# Patient Record
Sex: Female | Born: 1965
Health system: Southern US, Community
[De-identification: ages and names within clinical notes are randomized; demographics above are authoritative.]

## PROBLEM LIST (undated history)

## (undated) DIAGNOSIS — F32A Depression, unspecified: Secondary | ICD-10-CM

## (undated) DIAGNOSIS — F419 Anxiety disorder, unspecified: Secondary | ICD-10-CM

## (undated) DIAGNOSIS — F329 Major depressive disorder, single episode, unspecified: Secondary | ICD-10-CM

## (undated) DIAGNOSIS — J45909 Unspecified asthma, uncomplicated: Secondary | ICD-10-CM

## (undated) HISTORY — DX: Depression, unspecified: F32.A

## (undated) HISTORY — DX: Anxiety disorder, unspecified: F41.9

## (undated) HISTORY — DX: Major depressive disorder, single episode, unspecified: F32.9

## (undated) HISTORY — DX: Unspecified asthma, uncomplicated: J45.909

---

## 1988-06-09 HISTORY — PX: ECTOPIC PREGNANCY SURGERY: SHX613

## 2014-09-19 ENCOUNTER — Encounter: Payer: Self-pay | Admitting: Physician Assistant

## 2014-09-19 ENCOUNTER — Ambulatory Visit (INDEPENDENT_AMBULATORY_CARE_PROVIDER_SITE_OTHER): Payer: BLUE CROSS/BLUE SHIELD | Admitting: Physician Assistant

## 2014-09-19 ENCOUNTER — Ambulatory Visit (INDEPENDENT_AMBULATORY_CARE_PROVIDER_SITE_OTHER): Payer: BLUE CROSS/BLUE SHIELD

## 2014-09-19 VITALS — BP 127/86 | HR 96 | Ht 70.0 in | Wt 217.0 lb

## 2014-09-19 DIAGNOSIS — F329 Major depressive disorder, single episode, unspecified: Secondary | ICD-10-CM | POA: Diagnosis not present

## 2014-09-19 DIAGNOSIS — R109 Unspecified abdominal pain: Secondary | ICD-10-CM

## 2014-09-19 DIAGNOSIS — K5900 Constipation, unspecified: Secondary | ICD-10-CM | POA: Diagnosis not present

## 2014-09-19 DIAGNOSIS — R1084 Generalized abdominal pain: Secondary | ICD-10-CM | POA: Diagnosis not present

## 2014-09-19 DIAGNOSIS — R140 Abdominal distension (gaseous): Secondary | ICD-10-CM

## 2014-09-19 DIAGNOSIS — F411 Generalized anxiety disorder: Secondary | ICD-10-CM

## 2014-09-19 DIAGNOSIS — F32A Depression, unspecified: Secondary | ICD-10-CM

## 2014-09-19 DIAGNOSIS — K589 Irritable bowel syndrome without diarrhea: Secondary | ICD-10-CM | POA: Diagnosis not present

## 2014-09-19 MED ORDER — BUPROPION HCL 100 MG PO TABS: 100.0000 mg | ORAL_TABLET | Freq: Every day | ORAL | Status: DC

## 2014-09-19 MED ORDER — SERTRALINE HCL 50 MG PO TABS: 50.0000 mg | ORAL_TABLET | Freq: Every day | ORAL | Status: DC

## 2014-09-19 NOTE — Progress Notes (Signed)
   Subjective:    Patient ID: Mallory Cooke, female    DOB: Oct 13, 1965, 49 y.o.   MRN: [id number]  HPI   Pt is a 49 yo female who presents to the clinic to establish care.   .. Active Ambulatory Problems    Diagnosis Date Noted  . Depression 09/19/2014  . Generalized anxiety disorder 09/19/2014  . Abdominal discomfort 09/19/2014  . CN (constipation) 09/19/2014   Resolved Ambulatory Problems    Diagnosis Date Noted  . No Resolved Ambulatory Problems   No Additional Past Medical History   .Marland Kitchen. Family History  Problem Relation Age of Onset  . Alcohol abuse Father    .Marland Kitchen. History   Social History  . Marital Status: Married    Spouse Name: N/A  . Number of Children: N/A  . Years of Education: N/A   Occupational History  . Not on file.   Social History Main Topics  . Smoking status: Never Smoker   . Smokeless tobacco: Not on file  . Alcohol Use: Not on file  . Drug Use: Not on file  . Sexual Activity: No   Other Topics Concern  . Not on file   Social History Narrative  . No narrative on file   Pt presents to the clinic GI concerns. She was seeing GI, Dr. Joseph ArtWoods, [facility] but not happy with service. Did a pelvic u/s and scheduled colonscopy but never had colonoscopy done. She felt she was not being communicated with. GI symptoms for over 1 month. No hx of constipation but stools are hard and less frequent and then she will go to diarrhea side. She has constant abdominal discomfort. She has some nausea but no vomiting. No flank pain. No upper abdominal pain mostly on lower. No fever or chills. No blood in stool. Drinking juice and taking fiber every day.    Review of Systems  All other systems reviewed and are negative.      Objective:   Physical Exam  Constitutional: She is oriented to person, place, and time. She appears well-developed and well-nourished.  HENT:  Head: Normocephalic and atraumatic.  Cardiovascular: Normal rate, regular rhythm and normal heart sounds.    Pulmonary/Chest: Effort normal and breath sounds normal. She has no wheezes.  Abdominal: Soft. Bowel sounds are normal. She exhibits no distension and no mass. There is no tenderness. There is no rebound and no guarding.  Neurological: She is alert and oriented to person, place, and time.  Psychiatric: She has a normal mood and affect. Her behavior is normal.          Assessment & Plan:  Depression/anxiety- PHQ-9 was 5. GAD-7 was 7. Refilled wellbutrin and zoloft for 6 months. Continue with counselor.   Constipation/abdominal discomfort/bloating- not happy with Dr. Joseph ArtWoods and staff. Unclear etiology at this point but sounds like she is not having complete bowel movements. Discussed ordering a CT but I feel like might be too soon. Will send to digestive health for colonoscopy. I suggested to try miralax and probiotic. Will get abdominal xray today to look for stool trapped in colon. This could represent some IBS gave HO and discussed to keep diary. Certainly with her increased anxiety about this matter could be effect GI symptoms.   Pt feels like she is up to date on vaccines/mammogram/pap smear will wait to add to get records.

## 2014-09-19 NOTE — Patient Instructions (Signed)
Certainly could try miralax 1 capful daily.  Consider probiotic.   Irritable Bowel Syndrome Irritable bowel syndrome (IBS) is caused by a disturbance of normal bowel function and is a common digestive disorder. You may also hear this condition called spastic colon, mucous colitis, and irritable colon. There is no cure for IBS. However, symptoms often gradually improve or disappear with a good diet, stress management, and medicine. This condition usually appears in late adolescence or early adulthood. Women develop it twice as often as men. CAUSES  After food has been digested and absorbed in the small intestine, waste material is moved into the large intestine, or colon. In the colon, water and salts are absorbed from the undigested products coming from the small intestine. The remaining residue, or fecal material, is held for elimination. Under normal circumstances, gentle, rhythmic contractions of the bowel walls push the fecal material along the colon toward the rectum. In IBS, however, these contractions are irregular and poorly coordinated. The fecal material is either retained too long, resulting in constipation, or expelled too soon, producing diarrhea. SIGNS AND SYMPTOMS  The most common symptom of IBS is abdominal pain. It is often in the lower left side of the abdomen, but it may occur anywhere in the abdomen. The pain comes from spasms of the bowel muscles happening too much and from the buildup of gas and fecal material in the colon. This pain:  Can range from sharp abdominal cramps to a dull, continuous ache.  Often worsens soon after eating.  Is often relieved by having a bowel movement or passing gas. Abdominal pain is usually accompanied by constipation, but it may also produce diarrhea. The diarrhea often occurs right after a meal or upon waking up in the morning. The stools are often soft, watery, and flecked with mucus. Other symptoms of IBS include:  Bloating.  Loss of  appetite.  Heartburn.  Backache.  Dull pain in the arms or shoulders.  Nausea.  Burping.  Vomiting.  Gas. IBS may also cause symptoms that are unrelated to the digestive system, such as:  Fatigue.  Headaches.  Anxiety.  Shortness of breath.  Trouble concentrating.  Dizziness. These symptoms tend to come and go. DIAGNOSIS  The symptoms of IBS may seem like symptoms of other, more serious digestive disorders. Your health care provider may want to perform tests to exclude these disorders.  TREATMENT Many medicines are available to help correct bowel function or relieve bowel spasms and abdominal pain. Among the medicines available are:  Laxatives for severe constipation and to help restore normal bowel habits.  Specific antidiarrheal medicines to treat severe or lasting diarrhea.  Antispasmodic agents to relieve intestinal cramps. Your health care provider may also decide to treat you with a mild tranquilizer or sedative during unusually stressful periods in your life. Your health care provider may also prescribe antidepressant medicine. The use of this medicine has been shown to reduce pain and other symptoms of IBS. Remember that if any medicine is prescribed for you, you should take it exactly as directed. Make sure your health care provider knows how well it worked for you. HOME CARE INSTRUCTIONS   Take all medicines as directed by your health care provider.  Avoid foods that are high in fat or oils, such as heavy cream, butter, frankfurters, sausage, and other fatty meats.  Avoid foods that make you go to the bathroom, such as fruit, fruit juice, and dairy products.  Cut out carbonated drinks, chewing gum, and "gassy" foods  such as beans and cabbage. This may help relieve bloating and burping.  Eat foods with bran, and drink plenty of liquids with the bran foods. This helps relieve constipation.  Keep track of what foods seem to bring on your symptoms.  Avoid  emotionally charged situations or circumstances that produce anxiety.  Start or continue exercising.  Get plenty of rest and sleep. Document Released: 05/26/2005 Document Revised: 05/31/2013 Document Reviewed: 01/14/2008 [facility] Patient Information 2015 [place]. This information is not intended to replace advice given to you by your health care provider. Make sure you discuss any questions you have with your health care provider.

## 2014-09-22 ENCOUNTER — Encounter: Payer: Self-pay | Admitting: Physician Assistant

## 2014-10-11 ENCOUNTER — Encounter: Payer: Self-pay | Admitting: Physician Assistant

## 2014-10-11 NOTE — Telephone Encounter (Signed)
Please be advised that there is a problem with my enrollment in the [facility].  It is currently being investigated and should soon be resolved.  Once it is resolved, either I or [employer] representative will contact your office to request that you resubmit claims.  I am not sure what is going on yet, just know that claims will need to be resubmitted once resolved.  Please let me know that you received this message and know that I am working diligently to get this resolved.  I will be in touch.  Thank you.   msg received from [email or web address]

## 2014-10-27 ENCOUNTER — Emergency Department (INDEPENDENT_AMBULATORY_CARE_PROVIDER_SITE_OTHER)
Admission: EM | Admit: 2014-10-27 | Discharge: 2014-10-27 | Disposition: A | Discharge status: Home / Self Care | Payer: BLUE CROSS/BLUE SHIELD | Source: Home / Self Care | Attending: Emergency Medicine | Admitting: Emergency Medicine

## 2014-10-27 ENCOUNTER — Encounter: Payer: Self-pay | Admitting: *Deleted

## 2014-10-27 DIAGNOSIS — J309 Allergic rhinitis, unspecified: Secondary | ICD-10-CM

## 2014-10-27 DIAGNOSIS — J029 Acute pharyngitis, unspecified: Secondary | ICD-10-CM

## 2014-10-27 LAB — POCT RAPID STREP A (OFFICE): Rapid Strep A Screen: NEGATIVE

## 2014-10-27 MED ORDER — AZITHROMYCIN 250 MG PO TABS: ORAL_TABLET | ORAL | Status: DC

## 2014-10-27 MED ORDER — FLUTICASONE PROPIONATE 50 MCG/ACT NA SUSP: NASAL | Status: DC

## 2014-10-27 NOTE — ED Provider Notes (Signed)
CSN: [id number]     Arrival date & time 10/27/14  0818 History   First MD Initiated Contact with Patient 10/27/14 [phone]     Chief Complaint  Patient presents with  . Sore Throat  . Nasal Congestion  . Generalized Body Aches   (Consider location/radiation/quality/duration/timing/severity/associated sxs/prior Treatment) HPI URI HISTORY  Mallory Cooke is a 49 y.o. female who complains of onset of sore throat, congestion ,cold symptoms for 2 days.  Have been using over-the-counter treatment which helps a little bit.--Some sneezing and allergy symptoms for 3 days.  No chills/sweats No  Fever  +  Nasal congestion, clear drainage +  Discolored Post-nasal drainage No sinus pain/pressure Positive sore throat  No cough She has a history of asthma, but denies wheezing or chest congestion  No chest congestion No hemoptysis No shortness of breath No pleuritic pain  No itchy/red eyes No earache  No nausea No vomiting No abdominal pain No diarrhea  No skin rashes +  Fatigue Mild myalgias Mild, nonfocal headache   Past Medical History  Diagnosis Date  . Depression   . Anxiety    Past Surgical History  Procedure Laterality Date  . Ectopic pregnancy surgery  1990    right tube taken out.    Family History  Problem Relation Age of Onset  . Alcohol abuse Father    History  Substance Use Topics  . Smoking status: Never Smoker   . Smokeless tobacco: Never Used  . Alcohol Use: No   OB History    No data available     Review of Systems Remainder of Review of Systems negative for acute change except as noted in the HPI.  Allergies  Avelox and Other  Home Medications   Prior to Admission medications   Medication Sig Start Date End Date Taking? Authorizing Provider  ALBUTEROL IN Inhale into the lungs as needed.    Historical Provider, MD  azithromycin (ZITHROMAX Z-PAK) 250 MG tablet Take 2 tablets on day one, then 1 tablet daily on days 2 through 5 10/27/14   Lajean Manesavid Massey, MD   buPROPion [facility](WELLBUTRIN) 100 MG tablet Take 1 tablet (100 mg total) by mouth daily. 09/19/14   Jade L Breeback, PA-C  Cyclobenzaprine HCl (FLEXERIL PO) Take by mouth every 30 (thirty) days. Once a Month for Cramps    Historical Provider, MD  fluticasone (FLONASE) 50 MCG/ACT nasal spray 1 or 2 sprays each nostril twice a day 10/27/14   Lajean Manesavid Massey, MD  omeprazole (PRILOSEC OTC) 20 MG tablet Take 20 mg by mouth as needed.    Historical Provider, MD  sertraline (ZOLOFT) 50 MG tablet Take 1 tablet (50 mg total) by mouth daily. 09/19/14   Jade L Breeback, PA-C   BP 116/78 mmHg  Pulse 89  Temp(Src) 98.4 F (36.9 C) (Oral)  Resp 16  Ht 5\' 10"  (1.778 m)  Wt 219 lb (99.338 kg)  BMI 31.42 kg/m2  SpO2 98% Physical Exam  Constitutional: She is oriented to person, place, and time. She appears well-developed and well-nourished. She is cooperative.  Non-toxic appearance. No distress.  HENT:  Head: Normocephalic and atraumatic.  Right Ear: Tympanic membrane, external ear and ear canal normal.  Left Ear: Tympanic membrane, external ear and ear canal normal.  Nose: Right sinus exhibits no maxillary sinus tenderness and no frontal sinus tenderness. Left sinus exhibits no maxillary sinus tenderness and no frontal sinus tenderness.  Mouth/Throat: Mucous membranes are normal. Posterior oropharyngeal erythema present. No oropharyngeal exudate or posterior oropharyngeal edema.  Nose with boggy turbinates, mild congestion, mild serous drainage  Eyes: Conjunctivae are normal. No scleral icterus.  Neck: Neck supple.  Cardiovascular: Normal rate, regular rhythm and normal heart sounds.   No murmur heard. Pulmonary/Chest: Effort normal and breath sounds normal. No stridor. No respiratory distress. She has no wheezes. She has no rales.  Musculoskeletal: She exhibits no edema.  Lymphadenopathy:    She has cervical adenopathy.       Right cervical: Superficial cervical adenopathy present. No deep cervical and no  posterior cervical adenopathy present.      Left cervical: Superficial cervical adenopathy present. No deep cervical and no posterior cervical adenopathy present.  Neurological: She is alert and oriented to person, place, and time.  Skin: Skin is warm and dry.  Psychiatric: She has a normal mood and affect.  Nursing note and vitals reviewed.   ED Course  Procedures (including critical care time) Labs Review Labs Reviewed  POCT RAPID STREP A (OFFICE)   Results for orders placed or performed during the hospital encounter of 10/27/14  POCT rapid strep A  Result Value Ref Range   Rapid Strep A Screen Negative Negative     Imaging Review No results found.   MDM   1. Acute pharyngitis, unspecified pharyngitis type   2. Allergic sinusitis    Likely viral pharyngitis. Treatment options discussed, as well as risks, benefits, alternatives. Patient voiced understanding and agreement with the following plans:  Z-Pak--printed prescription given. Fill this if not improving in 2-3 days or if developing fever or bacterial URI symptoms. Flonase. She has this at home. Advised to use regularly for sinus allergy symptoms. Other OTC decongestant and antihistamine combinations discussed. Follow-up with your primary care doctor in 5-7 days if not improving, or sooner if symptoms become worse. Precautions discussed. Red flags discussed. Questions invited and answered. Patient voiced understanding and agreement.    Lajean Manesavid Massey, MD 10/27/14 [phone]

## 2014-10-27 NOTE — Discharge Instructions (Signed)
Today, rapid strep test was negative.

## 2014-10-27 NOTE — ED Notes (Signed)
Pt c/o 3 days of sneezing, today awoke with sore throat, aches

## 2015-03-26 ENCOUNTER — Other Ambulatory Visit: Payer: Self-pay | Admitting: Physician Assistant

## 2015-05-12 ENCOUNTER — Other Ambulatory Visit: Payer: Self-pay | Admitting: Physician Assistant

## 2015-06-06 ENCOUNTER — Encounter: Payer: Self-pay | Admitting: Physician Assistant

## 2015-06-06 ENCOUNTER — Other Ambulatory Visit: Payer: Self-pay | Admitting: Physician Assistant

## 2015-06-06 MED ORDER — BUPROPION HCL 100 MG PO TABS: 100.0000 mg | ORAL_TABLET | Freq: Every day | ORAL | Status: DC

## 2015-06-06 MED ORDER — SERTRALINE HCL 50 MG PO TABS: 50.0000 mg | ORAL_TABLET | Freq: Every day | ORAL | Status: DC

## 2015-10-13 ENCOUNTER — Other Ambulatory Visit: Payer: Self-pay | Admitting: Physician Assistant

## 2015-12-05 ENCOUNTER — Other Ambulatory Visit: Payer: Self-pay | Admitting: Physician Assistant

## 2015-12-08 ENCOUNTER — Other Ambulatory Visit: Payer: Self-pay | Admitting: Physician Assistant

## 2016-01-08 ENCOUNTER — Other Ambulatory Visit: Payer: Self-pay | Admitting: Physician Assistant

## 2016-01-28 ENCOUNTER — Encounter: Payer: Self-pay | Admitting: Physician Assistant

## 2016-01-28 ENCOUNTER — Ambulatory Visit (INDEPENDENT_AMBULATORY_CARE_PROVIDER_SITE_OTHER): Payer: Self-pay | Admitting: Physician Assistant

## 2016-01-28 ENCOUNTER — Ambulatory Visit (INDEPENDENT_AMBULATORY_CARE_PROVIDER_SITE_OTHER): Payer: Self-pay

## 2016-01-28 VITALS — BP 116/64 | HR 84 | Ht 70.0 in | Wt 216.0 lb

## 2016-01-28 DIAGNOSIS — F32A Depression, unspecified: Secondary | ICD-10-CM

## 2016-01-28 DIAGNOSIS — Z79899 Other long term (current) drug therapy: Secondary | ICD-10-CM

## 2016-01-28 DIAGNOSIS — Z13220 Encounter for screening for lipoid disorders: Secondary | ICD-10-CM

## 2016-01-28 DIAGNOSIS — F411 Generalized anxiety disorder: Secondary | ICD-10-CM

## 2016-01-28 DIAGNOSIS — G43109 Migraine with aura, not intractable, without status migrainosus: Secondary | ICD-10-CM | POA: Insufficient documentation

## 2016-01-28 DIAGNOSIS — Z87448 Personal history of other diseases of urinary system: Secondary | ICD-10-CM

## 2016-01-28 DIAGNOSIS — F329 Major depressive disorder, single episode, unspecified: Secondary | ICD-10-CM

## 2016-01-28 DIAGNOSIS — M79671 Pain in right foot: Secondary | ICD-10-CM

## 2016-01-28 LAB — POCT URINALYSIS DIPSTICK
BILIRUBIN UA: NEGATIVE
Blood, UA: NEGATIVE
Glucose, UA: NEGATIVE
Ketones, UA: NEGATIVE
Leukocytes, UA: NEGATIVE
NITRITE UA: NEGATIVE
PH UA: 6.5
PROTEIN UA: NEGATIVE
Spec Grav, UA: 1.025
Urobilinogen, UA: 0.2

## 2016-01-28 MED ORDER — BUPROPION HCL 100 MG PO TABS: 100.0000 mg | ORAL_TABLET | Freq: Every day | ORAL | 4 refills | Status: DC

## 2016-01-28 MED ORDER — SERTRALINE HCL 50 MG PO TABS: 50.0000 mg | ORAL_TABLET | Freq: Every day | ORAL | 4 refills | Status: DC

## 2016-01-28 NOTE — Progress Notes (Signed)
   Subjective:    Patient ID: Mallory Cooke, female    DOB: 05/09/1966, 50 y.o.   MRN: [id number]  HPI  Patient is a 50 year old female who presents to the clinic for medication refill on depression and anxiety. It has been over a year. She is on Zoloft and Wellbutrin. She is doing well. She denies any anxiety or depression. She denies any suicidal or homicidal thoughts.  She also complains of one week of right foot pain and mass that has developed on her plantar foot on ball of foot. She does not notice the mass getting bigger but is not getting smaller. If feels like she is walking on an egg. Walking does make worse. She does walk quite a bit at work. She does remember about a week ago she came a ottoman and bruising her second and third toes. Massing to develop later. She is taking Advil for her headaches but has not made any correlation if helps with the symptoms. She has not done anything else to make better.   Review of Systems See HPI.     Objective:   Physical Exam  Constitutional: She appears well-developed and well-nourished.  HENT:  Head: Normocephalic and atraumatic.  Cardiovascular: Normal rate, regular rhythm and normal heart sounds.   Musculoskeletal:  2cm by 2.5cm soft tender mass of Right foot plantar surface around ball of foot between 2nd and 3rd toes. No erythema.           Assessment & Plan:  Depression/Anxiety- PHQ-9 was 2. GAD-7 was 3. Refilled wellbutrin and zoloft for one year. cmp ordered.   Right foot pain/growth of right foot- xray ordered of right foot no acute findings. Appears to be morton neuroma or plantar fibroma(although usually take more time to develop). Discussed ice, NSAID, and good supportive shoe. Follow up with sports medicine for orthotics.    Hx of hematuria- .. Results for orders placed or performed in visit on 01/28/16  POCT urinalysis dipstick  Result Value Ref Range   Color, UA yellow    Clarity, UA clear    Glucose, UA neg    Bilirubin, UA neg    Ketones, UA neg    Spec Grav, UA 1.025    Blood, UA neg    pH, UA 6.5    Protein, UA neg    Urobilinogen, UA 0.2    Nitrite, UA neg    Leukocytes, UA Negative Negative   Reassured pt looked good.   Lipid and cmp ordered.

## 2016-01-28 NOTE — Patient Instructions (Addendum)
Regular advil/ice/supportive shoes  Morton Neuroma/Plantar fibroma  Morton Neuralgia Morton neuralgia is a type of foot pain in the area closest to your toes. This area is sometimes called the ball of your foot. Morton neuralgia occurs when a branch of a nerve in your foot (digital nerve) becomes compressed.  When this happens over a long period of time, the nerve can thicken (neuroma) and cause pain. This usually occurs between the third and fourth toe. Morton neuralgia can come and go but may get worse over time.  CAUSES Your digital nerve can become compressed and stretched at a point where it passes under a thick band of tissue that connects your toes (intermetatarsal ligament). Morton neuralgia can be caused by mild repetitive damage in this area. This type of damage can result from:   Activities such as running or jumping.  Wearing shoes that are too tight. RISK FACTORS You may be at risk for Morton neuralgia if you:  Are female.  Wear high heels.  Wear shoes that are narrow or tight.  Participate in activities that stretch your toes. These include:  Running.  Ballet.  Long-distance walking. SIGNS AND SYMPTOMS The first symptom of Morton neuralgia is pain that spreads from the ball of your foot to your toes. It may feel like you are walking on a marble. Pain usually gets worse with walking and goes away at night. Other symptoms may include numbness and cramping of your toes. DIAGNOSIS  Your health care provider will do a physical exam. When doing the exam, your health care provider may:   Squeeze your foot just behind your toe.  Ask you to move your toes to check for pain. You may also have tests on your foot to confirm the diagnosis. These may include:   An X-ray.  An MRI. TREATMENT  Treatment for Morton neuralgia may be as simple as changing the kind of shoes you wear. Other treatments may include:  Wearing a supportive pad (orthosis) under the front of your foot.  This lifts your toe bones and takes pressure off the nerve.  Getting injections of numbing medicine and anti-inflammatory medicine (steroid) in the nerve.  Having surgery to remove part of the thickened nerve. HOME CARE INSTRUCTIONS   Take medicine only as directed by your health care provider.  Wear soft-soled shoes with a wide toe area.  Stop activities that may be causing pain.  Elevate your foot when resting.  Massage your foot.  Apply ice to the injured area:   Put ice in a plastic bag.  Place a towel between your skin and the bag.  Leave the ice on for 20 minutes, 2-3 times a day.   Keep all follow-up visits as directed by your health care provider. This is important. SEEK MEDICAL CARE IF:  Home care instructions are not helping you get better.  Your symptoms change or get worse.   This information is not intended to replace advice given to you by your health care provider. Make sure you discuss any questions you have with your health care provider.   Document Released: 09/01/2000 Document Revised: 06/16/2014 Document Reviewed: 07/27/2013 Elsevier Interactive Patient Education [employer]2016 Elsevier Inc.

## 2016-01-29 DIAGNOSIS — Z87448 Personal history of other diseases of urinary system: Secondary | ICD-10-CM | POA: Insufficient documentation

## 2016-01-29 DIAGNOSIS — M79671 Pain in right foot: Secondary | ICD-10-CM | POA: Insufficient documentation

## 2016-09-01 ENCOUNTER — Other Ambulatory Visit: Payer: Self-pay | Admitting: Physician Assistant

## 2016-12-04 ENCOUNTER — Other Ambulatory Visit: Payer: Self-pay | Admitting: Physician Assistant

## 2017-03-11 ENCOUNTER — Ambulatory Visit (INDEPENDENT_AMBULATORY_CARE_PROVIDER_SITE_OTHER): Payer: Managed Care, Other (non HMO) | Admitting: Physician Assistant

## 2017-03-11 ENCOUNTER — Encounter: Payer: Self-pay | Admitting: Physician Assistant

## 2017-03-11 VITALS — BP 127/59 | HR 74 | Ht 70.0 in | Wt 214.0 lb

## 2017-03-11 DIAGNOSIS — Z79899 Other long term (current) drug therapy: Secondary | ICD-10-CM | POA: Diagnosis not present

## 2017-03-11 DIAGNOSIS — J45909 Unspecified asthma, uncomplicated: Secondary | ICD-10-CM | POA: Insufficient documentation

## 2017-03-11 DIAGNOSIS — Z13220 Encounter for screening for lipoid disorders: Secondary | ICD-10-CM | POA: Diagnosis not present

## 2017-03-11 DIAGNOSIS — F411 Generalized anxiety disorder: Secondary | ICD-10-CM

## 2017-03-11 DIAGNOSIS — F330 Major depressive disorder, recurrent, mild: Secondary | ICD-10-CM

## 2017-03-11 DIAGNOSIS — R109 Unspecified abdominal pain: Secondary | ICD-10-CM | POA: Diagnosis not present

## 2017-03-11 MED ORDER — CYCLOBENZAPRINE HCL 10 MG PO TABS: ORAL_TABLET | ORAL | 3 refills | Status: DC

## 2017-03-11 MED ORDER — SERTRALINE HCL 50 MG PO TABS: 50.0000 mg | ORAL_TABLET | Freq: Every day | ORAL | 4 refills | Status: DC

## 2017-03-11 MED ORDER — BUPROPION HCL 100 MG PO TABS: 100.0000 mg | ORAL_TABLET | Freq: Two times a day (BID) | ORAL | 3 refills | Status: DC

## 2017-03-11 NOTE — Progress Notes (Signed)
   Subjective:    Patient ID: Mallory Cooke, female    DOB: January 13, 1966, 51 y.o.   MRN: [id number]  HPI  Pt is a 51 yo female who presents to the clinic for medication refill. She is on zoloft and wellbutrin but only taking wellbutrin once a day. She does not feel like she is where she wants to be with mood. The only other antidepressant is celexa and did not have great benefit. Pt denies any SI/HC. She is not exercising.   Pt uses flexeril as needed for abdominal discomfort. She does not use often maybe once a month. She needs refill.   .. Active Ambulatory Problems    Diagnosis Date Noted  . Depression 09/19/2014  . Generalized anxiety disorder 09/19/2014  . Migraine with aura and without status migrainosus, not intractable 01/28/2016  . History of hematuria 01/29/2016  . Asthma 03/11/2017   Resolved Ambulatory Problems    Diagnosis Date Noted  . Abdominal discomfort 09/19/2014  . CN (constipation) 09/19/2014  . Acute pain of right foot 01/29/2016   Past Medical History:  Diagnosis Date  . Anxiety   . Asthma   . Depression       Review of Systems  All other systems reviewed and are negative.      Objective:   Physical Exam  Constitutional: She is oriented to person, place, and time. She appears well-developed and well-nourished.  HENT:  Head: Normocephalic and atraumatic.  Cardiovascular: Normal rate, regular rhythm and normal heart sounds.   Pulmonary/Chest: Effort normal and breath sounds normal.  Neurological: She is alert and oriented to person, place, and time.  Psychiatric: She has a normal mood and affect. Her behavior is normal.          Assessment & Plan:  Marland KitchenMarland KitchenDiagnoses and all orders for this visit:  Mild episode of recurrent major depressive disorder (HCC) -     buPROPion (WELLBUTRIN) 100 MG tablet; Take 1 tablet (100 mg total) by mouth 2 (two) times daily. -     sertraline (ZOLOFT) 50 MG tablet; Take 1 tablet (50 mg total) by mouth  daily.  Uncomplicated asthma, unspecified asthma severity, unspecified whether persistent  Generalized anxiety disorder  Medication management -     COMPLETE METABOLIC PANEL WITH GFR  Screening for lipid disorders -     Lipid Panel w/reflex Direct LDL  Other orders -     cyclobenzaprine (FLEXERIL) 10 MG tablet; TAKE ONE TABLET EVERY 8 HOURS AS NEEDED FOR PAIN   .Marland Kitchen Depression screen [facility] 2/9 03/11/2017 01/28/2016  Decreased Interest 0 0  Down, Depressed, Hopeless 1 1  PHQ - 2 Score 1 1  Altered sleeping - 0  Tired, decreased energy - 1  Change in appetite - 0  Feeling bad or failure about yourself  - 0  Trouble concentrating - 0  Moving slowly or fidgety/restless - 0  Suicidal thoughts - 0  PHQ-9 Score - 2   She would like to increase her medication but when she increases zoloft causes headaches. Discussed other options for medication for depression anxiety. She does not want to make any changes today. Printed out options for her to consider and follow up when she does. In meantime sent refills. Encouraged patient to take wellbutrin twice a day as directed.   Pt is interested in genesight to determine what anti-depressant would be the best for her.   Fasting labs ordered.

## 2017-07-03 DIAGNOSIS — F4322 Adjustment disorder with anxiety: Secondary | ICD-10-CM | POA: Diagnosis not present

## 2017-07-23 DIAGNOSIS — F4322 Adjustment disorder with anxiety: Secondary | ICD-10-CM | POA: Diagnosis not present

## 2017-08-13 DIAGNOSIS — F4322 Adjustment disorder with anxiety: Secondary | ICD-10-CM | POA: Diagnosis not present

## 2017-08-27 DIAGNOSIS — F4322 Adjustment disorder with anxiety: Secondary | ICD-10-CM | POA: Diagnosis not present

## 2017-09-10 DIAGNOSIS — F4322 Adjustment disorder with anxiety: Secondary | ICD-10-CM | POA: Diagnosis not present

## 2017-09-24 DIAGNOSIS — F4322 Adjustment disorder with anxiety: Secondary | ICD-10-CM | POA: Diagnosis not present

## 2017-10-15 DIAGNOSIS — F4322 Adjustment disorder with anxiety: Secondary | ICD-10-CM | POA: Diagnosis not present

## 2017-10-22 DIAGNOSIS — F4322 Adjustment disorder with anxiety: Secondary | ICD-10-CM | POA: Diagnosis not present

## 2017-11-05 DIAGNOSIS — F4322 Adjustment disorder with anxiety: Secondary | ICD-10-CM | POA: Diagnosis not present

## 2017-11-09 DIAGNOSIS — F4322 Adjustment disorder with anxiety: Secondary | ICD-10-CM | POA: Diagnosis not present

## 2017-11-18 ENCOUNTER — Telehealth: Payer: Self-pay | Admitting: Physician Assistant

## 2017-11-18 ENCOUNTER — Ambulatory Visit (INDEPENDENT_AMBULATORY_CARE_PROVIDER_SITE_OTHER): Payer: BLUE CROSS/BLUE SHIELD | Admitting: Physician Assistant

## 2017-11-18 ENCOUNTER — Encounter: Payer: Self-pay | Admitting: Physician Assistant

## 2017-11-18 ENCOUNTER — Ambulatory Visit (INDEPENDENT_AMBULATORY_CARE_PROVIDER_SITE_OTHER): Payer: BLUE CROSS/BLUE SHIELD

## 2017-11-18 VITALS — BP 108/62 | HR 77 | Ht 70.0 in | Wt 213.0 lb

## 2017-11-18 DIAGNOSIS — S6991XA Unspecified injury of right wrist, hand and finger(s), initial encounter: Secondary | ICD-10-CM

## 2017-11-18 DIAGNOSIS — S6721XA Crushing injury of right hand, initial encounter: Secondary | ICD-10-CM

## 2017-11-18 DIAGNOSIS — M79641 Pain in right hand: Secondary | ICD-10-CM | POA: Diagnosis not present

## 2017-11-18 NOTE — Progress Notes (Signed)
   Subjective:    Patient ID: Mallory Cooke, female    DOB: [birth year], 52 y.o.   MRN: [id number]  HPI  Pt is a 52 yo female who presents to the clinic with right hand pain that is persisent after May 4th crushing incident of right hand. She is unsure what her hand got crushed in between. She was helping her mother ambulate when occurred. She thought would resolve on its own. Pain in the lateral to mid right hand continues. She is right handed and concerned because now just using a mouse causes pain the right hand. She does feel like there is some weakness as well. She can't open jars like she once could.   .. Active Ambulatory Problems    Diagnosis Date Noted  . Depression 09/19/2014  . Generalized anxiety disorder 09/19/2014  . Migraine with aura and without status migrainosus, not intractable 01/28/2016  . History of hematuria 01/29/2016  . Asthma 03/11/2017  . Crushing injury of right hand 11/20/2017  . Right hand pain 11/20/2017   Resolved Ambulatory Problems    Diagnosis Date Noted  . Abdominal discomfort 09/19/2014  . CN (constipation) 09/19/2014  . Acute pain of right foot 01/29/2016   Past Medical History:  Diagnosis Date  . Anxiety   . Asthma   . Depression       Review of Systems See HPI.     Objective:   Physical Exam  Constitutional: She is oriented to person, place, and time. She appears well-developed and well-nourished.  HENT:  Head: Normocephalic and atraumatic.  Musculoskeletal:  Right hand:  No bruising or swelling noted.  NROM at wrist.  Hand grip 4/5.  Pain to palpation just under the 3rd MCP.  Pain with extension and flexion.   Neurological: She is alert and oriented to person, place, and time.          Assessment & Plan:  Marland Kitchen.Marland Kitchen.Diagnoses and all orders for this visit:  Crushing injury of right hand, initial encounter  Right hand pain   Xray showed no fracture. Suspect some extensor tendons of the hand with some tendonitis. Pt has brace  at home encouraged to wear for a wee or so to give it some rest. Ibuprofen 800mg  for next 5 days. Ice twice a day. Follow up with sports medicine if not improving. May need MRI.

## 2017-11-18 NOTE — Telephone Encounter (Signed)
Right hand complete x-ray ordered.

## 2017-11-18 NOTE — Telephone Encounter (Signed)
Radiology reviewed and confirmed no fracture. Treatment plan stays the same.

## 2017-11-20 ENCOUNTER — Encounter: Payer: Self-pay | Admitting: Physician Assistant

## 2017-11-20 DIAGNOSIS — S6721XA Crushing injury of right hand, initial encounter: Secondary | ICD-10-CM | POA: Insufficient documentation

## 2017-11-20 DIAGNOSIS — M79641 Pain in right hand: Secondary | ICD-10-CM | POA: Insufficient documentation

## 2017-11-30 DIAGNOSIS — F4322 Adjustment disorder with anxiety: Secondary | ICD-10-CM | POA: Diagnosis not present

## 2017-12-17 DIAGNOSIS — F4322 Adjustment disorder with anxiety: Secondary | ICD-10-CM | POA: Diagnosis not present

## 2017-12-31 DIAGNOSIS — F4322 Adjustment disorder with anxiety: Secondary | ICD-10-CM | POA: Diagnosis not present

## 2018-01-28 DIAGNOSIS — F4322 Adjustment disorder with anxiety: Secondary | ICD-10-CM | POA: Diagnosis not present

## 2018-02-11 DIAGNOSIS — F4322 Adjustment disorder with anxiety: Secondary | ICD-10-CM | POA: Diagnosis not present

## 2018-03-01 DIAGNOSIS — F4322 Adjustment disorder with anxiety: Secondary | ICD-10-CM | POA: Diagnosis not present

## 2018-03-16 ENCOUNTER — Other Ambulatory Visit: Payer: Self-pay | Admitting: Physician Assistant

## 2018-03-16 DIAGNOSIS — F330 Major depressive disorder, recurrent, mild: Secondary | ICD-10-CM

## 2018-03-29 DIAGNOSIS — F4322 Adjustment disorder with anxiety: Secondary | ICD-10-CM | POA: Diagnosis not present

## 2018-04-12 DIAGNOSIS — F4322 Adjustment disorder with anxiety: Secondary | ICD-10-CM | POA: Diagnosis not present

## 2018-04-29 DIAGNOSIS — F4322 Adjustment disorder with anxiety: Secondary | ICD-10-CM | POA: Diagnosis not present

## 2018-05-04 ENCOUNTER — Other Ambulatory Visit: Payer: Self-pay | Admitting: Physician Assistant

## 2018-05-04 DIAGNOSIS — F330 Major depressive disorder, recurrent, mild: Secondary | ICD-10-CM

## 2018-05-31 ENCOUNTER — Telehealth: Payer: Self-pay | Admitting: Physician Assistant

## 2018-05-31 ENCOUNTER — Encounter: Payer: Self-pay | Admitting: Physician Assistant

## 2018-05-31 ENCOUNTER — Ambulatory Visit (INDEPENDENT_AMBULATORY_CARE_PROVIDER_SITE_OTHER): Payer: BLUE CROSS/BLUE SHIELD | Admitting: Physician Assistant

## 2018-05-31 VITALS — BP 112/69 | HR 73 | Ht 70.0 in | Wt 216.0 lb

## 2018-05-31 DIAGNOSIS — Z79899 Other long term (current) drug therapy: Secondary | ICD-10-CM | POA: Diagnosis not present

## 2018-05-31 DIAGNOSIS — Z13220 Encounter for screening for lipoid disorders: Secondary | ICD-10-CM

## 2018-05-31 DIAGNOSIS — Z1211 Encounter for screening for malignant neoplasm of colon: Secondary | ICD-10-CM | POA: Diagnosis not present

## 2018-05-31 DIAGNOSIS — F411 Generalized anxiety disorder: Secondary | ICD-10-CM

## 2018-05-31 DIAGNOSIS — Z1231 Encounter for screening mammogram for malignant neoplasm of breast: Secondary | ICD-10-CM

## 2018-05-31 DIAGNOSIS — F330 Major depressive disorder, recurrent, mild: Secondary | ICD-10-CM | POA: Diagnosis not present

## 2018-05-31 MED ORDER — SERTRALINE HCL 50 MG PO TABS: 50.0000 mg | ORAL_TABLET | Freq: Every day | ORAL | 4 refills | Status: DC

## 2018-05-31 MED ORDER — BUPROPION HCL 100 MG PO TABS: 100.0000 mg | ORAL_TABLET | Freq: Two times a day (BID) | ORAL | 4 refills | Status: DC

## 2018-05-31 NOTE — Progress Notes (Signed)
Subjective:    Patient ID: Mallory Cooke, female    DOB: Nov 26, 1965, 52 y.o.   MRN: [id number]  HPI  Pt is a 52 yo female with GAD, MDD who presents to the clinic for medication refill.   Pt feels like her anxiety and depression are controlled. No SI/HC. Taking zoloft and wellbutrin without any problems.   .. Active Ambulatory Problems    Diagnosis Date Noted  . Depression 09/19/2014  . Generalized anxiety disorder 09/19/2014  . Migraine with aura and without status migrainosus, not intractable 01/28/2016  . History of hematuria 01/29/2016  . Asthma 03/11/2017  . Crushing injury of right hand 11/20/2017  . Right hand pain 11/20/2017   Resolved Ambulatory Problems    Diagnosis Date Noted  . Abdominal discomfort 09/19/2014  . CN (constipation) 09/19/2014  . Acute pain of right foot 01/29/2016   Past Medical History:  Diagnosis Date  . Anxiety      Review of Systems  All other systems reviewed and are negative.      Objective:   Physical Exam Constitutional:      Appearance: Normal appearance.  HENT:     Head: Normocephalic and atraumatic.  Cardiovascular:     Rate and Rhythm: Normal rate and regular rhythm.  Pulmonary:     Effort: Pulmonary effort is normal.     Breath sounds: Normal breath sounds.  Neurological:     General: No focal deficit present.     Mental Status: She is alert and oriented to person, place, and time.  Psychiatric:        Mood and Affect: Mood normal.        Behavior: Behavior normal.           Assessment & Plan:  Marland Kitchen.Marland Kitchen.Mallory Cooke was seen today for follow-up.  Diagnoses and all orders for this visit:  Medication management -     COMPLETE METABOLIC PANEL WITH GFR  Mild episode of recurrent major depressive disorder (HCC) -     buPROPion (WELLBUTRIN) 100 MG tablet; Take 1 tablet (100 mg total) by mouth 2 (two) times daily. -     sertraline (ZOLOFT) 50 MG tablet; Take 1 tablet (50 mg total) by mouth daily.  Screening for lipid  disorders -     Lipid Panel w/reflex Direct LDL  Colon cancer screening -     Cologuard  Visit for screening mammogram -     MM 3D SCREEN BREAST BILATERAL   .Marland Kitchen. [facility] 05/31/2018 03/11/2017 01/28/2016  Decreased Interest 1 0 0  Down, Depressed, Hopeless 0 1 1  PHQ - 2 Score 1 1 1   Altered sleeping 0 - 0  Tired, decreased energy 1 - 1  Change in appetite 0 - 0  Feeling bad or failure about yourself  2 - 0  Trouble concentrating 1 - 0  Moving slowly or fidgety/restless 1 - 0  Suicidal thoughts 0 - 0  PHQ-9 Score 6 - 2  Difficult doing work/chores Not difficult at all - -   .Marland Kitchen. GAD 7 : Generalized Anxiety Score 05/31/2018 01/28/2016  Nervous, Anxious, on Edge 1 1  Control/stop worrying 0 0  Worry too much - different things 1 1  Trouble relaxing 0 0  Restless 0 0  Easily annoyed or irritable 1 0  Afraid - awful might happen 0 1  Total GAD 7 Score 3 3  Anxiety Difficulty Not difficult at all Not difficult at all    Refilled medications.  Pt needs CPE.  Discussed preventative health.  Mammogram and cologuard ordered.

## 2018-05-31 NOTE — Patient Instructions (Addendum)
Pap/mammogram/cologuard/labs/TDAP.   Needs CPE.

## 2018-05-31 NOTE — Telephone Encounter (Signed)
I printed cologuard information. Please make sure that was sent.

## 2018-06-03 NOTE — Telephone Encounter (Signed)
Cologuard information has been faxed over. Thanks!

## 2018-06-26 ENCOUNTER — Encounter: Payer: Self-pay | Admitting: Physician Assistant

## 2018-06-28 DIAGNOSIS — F4322 Adjustment disorder with anxiety: Secondary | ICD-10-CM | POA: Diagnosis not present

## 2018-07-07 ENCOUNTER — Ambulatory Visit (INDEPENDENT_AMBULATORY_CARE_PROVIDER_SITE_OTHER): Payer: BLUE CROSS/BLUE SHIELD | Admitting: Physician Assistant

## 2018-07-07 ENCOUNTER — Other Ambulatory Visit (HOSPITAL_COMMUNITY)
Admission: RE | Admit: 2018-07-07 | Discharge: 2018-07-07 | Disposition: A | Discharge status: Home / Self Care | Payer: BLUE CROSS/BLUE SHIELD | Source: Ambulatory Visit | Attending: Physician Assistant | Admitting: Physician Assistant

## 2018-07-07 ENCOUNTER — Ambulatory Visit (INDEPENDENT_AMBULATORY_CARE_PROVIDER_SITE_OTHER): Payer: BLUE CROSS/BLUE SHIELD

## 2018-07-07 ENCOUNTER — Encounter: Payer: Self-pay | Admitting: Physician Assistant

## 2018-07-07 VITALS — BP 117/57 | HR 74 | Ht 70.0 in | Wt 221.0 lb

## 2018-07-07 DIAGNOSIS — Z1211 Encounter for screening for malignant neoplasm of colon: Secondary | ICD-10-CM | POA: Diagnosis not present

## 2018-07-07 DIAGNOSIS — Z01419 Encounter for gynecological examination (general) (routine) without abnormal findings: Secondary | ICD-10-CM | POA: Diagnosis not present

## 2018-07-07 DIAGNOSIS — Z23 Encounter for immunization: Secondary | ICD-10-CM

## 2018-07-07 DIAGNOSIS — Z0000 Encounter for general adult medical examination without abnormal findings: Secondary | ICD-10-CM

## 2018-07-07 DIAGNOSIS — Z13220 Encounter for screening for lipoid disorders: Secondary | ICD-10-CM | POA: Diagnosis not present

## 2018-07-07 DIAGNOSIS — Z1231 Encounter for screening mammogram for malignant neoplasm of breast: Secondary | ICD-10-CM | POA: Diagnosis not present

## 2018-07-07 DIAGNOSIS — Z79899 Other long term (current) drug therapy: Secondary | ICD-10-CM | POA: Diagnosis not present

## 2018-07-07 LAB — COMPLETE METABOLIC PANEL WITH GFR
AG Ratio: 1.9 (calc) (ref 1.0–2.5)
ALT: 11 U/L (ref 6–29)
AST: 11 U/L (ref 10–35)
Albumin: 4.4 g/dL (ref 3.6–5.1)
Alkaline phosphatase (APISO): 87 U/L (ref 33–130)
BUN: 10 mg/dL (ref 7–25)
CALCIUM: 9.3 mg/dL (ref 8.6–10.4)
CO2: 25 mmol/L (ref 20–32)
Chloride: 106 mmol/L (ref 98–110)
Creat: 0.6 mg/dL (ref 0.50–1.05)
GFR, EST AFRICAN AMERICAN: 123 mL/min/{1.73_m2} (ref >=60)
GFR, EST NON AFRICAN AMERICAN: 106 mL/min/{1.73_m2} (ref >=60)
GLUCOSE: 96 mg/dL (ref 65–99)
Globulin: 2.3 g/dL (calc) (ref 1.9–3.7)
Potassium: 4.3 mmol/L (ref 3.5–5.3)
Sodium: 139 mmol/L (ref 135–146)
Total Bilirubin: 0.6 mg/dL (ref 0.2–1.2)
Total Protein: 6.7 g/dL (ref 6.1–8.1)

## 2018-07-07 LAB — LIPID PANEL W/REFLEX DIRECT LDL
Cholesterol: 228 mg/dL — ABNORMAL HIGH (ref <200)
HDL: 46 mg/dL — AB (ref >50)
LDL Cholesterol (Calc): 160 mg/dL (calc) — ABNORMAL HIGH
NON-HDL CHOLESTEROL (CALC): 182 mg/dL — AB (ref <130)
Total CHOL/HDL Ratio: 5 (calc) — ABNORMAL HIGH (ref <5.0)
Triglycerides: 107 mg/dL (ref <150)

## 2018-07-07 NOTE — Progress Notes (Signed)
Call pt: normal mammogram. Follow up in 1 year.

## 2018-07-07 NOTE — Addendum Note (Signed)
Addended by: Myer Peer on: 07/07/2018 08:42 AM   Modules accepted: Orders

## 2018-07-07 NOTE — Progress Notes (Signed)
Subjective:     Sultana Tierney is a 53 y.o. female and is here for a comprehensive physical exam. The patient reports no problems.  She was never sent cologuard kit and needs to be resent.   Social History   Socioeconomic History  . Marital status: Married    Spouse name: Not on file  . Number of children: Not on file  . Years of education: Not on file  . Highest education level: Not on file  Occupational History  . Not on file  Social Needs  . Financial resource strain: Not on file  . Food insecurity:    Worry: Not on file    Inability: Not on file  . Transportation needs:    Medical: Not on file    Non-medical: Not on file  Tobacco Use  . Smoking status: Never Smoker  . Smokeless tobacco: Never Used  Substance and Sexual Activity  . Alcohol use: No    Alcohol/week: 0.0 standard drinks  . Drug use: No  . Sexual activity: Never  Lifestyle  . Physical activity:    Days per week: Not on file    Minutes per session: Not on file  . Stress: Not on file  Relationships  . Social connections:    Talks on phone: Not on file    Gets together: Not on file    Attends religious service: Not on file    Active member of club or organization: Not on file    Attends meetings of clubs or organizations: Not on file    Relationship status: Not on file  . Intimate partner violence:    Fear of current or ex partner: Not on file    Emotionally abused: Not on file    Physically abused: Not on file    Forced sexual activity: Not on file  Other Topics Concern  . Not on file  Social History Narrative  . Not on file   Health Maintenance  Topic Date Due  . PAP SMEAR-Modifier  10/26/1988  . MAMMOGRAM  10/26/2017  . COLONOSCOPY  06/01/2019 (Originally 10/26/2017)  . HIV Screening  06/01/2019 (Originally 10/27/1982)  . TETANUS/TDAP  07/08/2019 (Originally 10/27/1986)  . INFLUENZA VACCINE  05/31/2028 (Originally 01/07/2018)    The following portions of the patient's history were reviewed and  updated as appropriate: allergies, current medications, past family history, past medical history, past social history, past surgical history and problem list.  Review of Systems A comprehensive review of systems was negative.   Objective:    BP (!) 117/57   Pulse 74   Ht _0  (1.778 m)   Wt 221 lb (100.2 kg)   BMI 31.71 kg/m  General appearance: alert, cooperative, appears stated age and mildly obese Head: Normocephalic, without obvious abnormality, atraumatic Eyes: conjunctivae/corneas clear. PERRL, EOM's intact. Fundi benign. Ears: normal TM's and external ear canals both ears Nose: Nares normal. Septum midline. Mucosa normal. No drainage or sinus tenderness. Throat: lips, mucosa, and tongue normal; teeth and gums normal Neck: no adenopathy, no carotid bruit, no JVD, supple, symmetrical, trachea midline and thyroid not enlarged, symmetric, no tenderness/mass/nodules Back: symmetric, no curvature. ROM normal. No CVA tenderness. Lungs: clear to auscultation bilaterally Heart: regular rate and rhythm, S1, S2 normal, no murmur, click, rub or gallop Abdomen: soft, non-tender; bowel sounds normal; no masses,  no organomegaly Pelvic: cervix normal in appearance, external genitalia normal, no adnexal masses or tenderness, no cervical motion tenderness, uterus normal size, shape, and consistency and vagina normal  without discharge Extremities: extremities normal, atraumatic, no cyanosis or edema Pulses: 2+ and symmetric Skin: Skin color, texture, turgor normal. No rashes or lesions Lymph nodes: Cervical, supraclavicular, and axillary nodes normal. Neurologic: Alert and oriented X 3, normal strength and tone. Normal symmetric reflexes. Normal coordination and gait    Assessment:    Healthy female exam.      Plan:    Marland KitchenMarland KitchenLazara was seen today for gynecologic exam.  Diagnoses and all orders for this visit:  Routine physical examination  Colon cancer screening -      Cologuard  Encounter for gynecological examination without abnormal finding   .Marland Kitchen Depression screen [facility] 2/9 07/07/2018 05/31/2018 03/11/2017 01/28/2016  Decreased Interest 0 1 0 0  Down, Depressed, Hopeless 1 0 1 1  PHQ - 2 Score _0 Altered sleeping 0 0 - 0  Tired, decreased energy 1 1 - 1  Change in appetite 0 0 - 0  Feeling bad or failure about yourself  0 2 - 0  Trouble concentrating 1 1 - 0  Moving slowly or fidgety/restless 0 1 - 0  Suicidal thoughts 0 0 - 0  PHQ-9 Score 3 6 - 2  Difficult doing work/chores Not difficult at all Not difficult at all - -   .Marland Kitchen Discussed 150 minutes of exercise a week.  Encouraged vitamin D 1000 units and Calcium 1366m or 4 servings of dairy a day.  Pt has fasting labs and will get drawn today.  Pap done today. Declined STD screening. HPV ordered. May go 5 years if normal.  Mammogram scheduled for today.  Will reorder cologuard kit. Tdap done today.  Discussed shingrix. Pt will consider. May schedule nurse visit if would like.   See After Visit Summary for Counseling Recommendations

## 2018-07-07 NOTE — Patient Instructions (Signed)
Health Maintenance for Postmenopausal Women Menopause is a normal process in which your reproductive ability comes to an end. This process happens gradually over a span of months to years, usually between the ages of 56 and 70. Menopause is complete when you have missed 12 consecutive menstrual periods. It is important to talk with your health care provider about some of the most common conditions that affect postmenopausal women, such as heart disease, cancer, and bone loss (osteoporosis). Adopting a healthy lifestyle and getting preventive care can help to promote your health and wellness. Those actions can also lower your chances of developing some of these common conditions. What should I know about menopause? During menopause, you may experience a number of symptoms, such as:  Moderate-to-severe hot flashes.  Night sweats.  Decrease in sex drive.  Mood swings.  Headaches.  Tiredness.  Irritability.  Memory problems.  Insomnia. Choosing to treat or not to treat menopausal changes is an individual decision that you make with your health care provider. What should I know about hormone replacement therapy and supplements? Hormone therapy products are effective for treating symptoms that are associated with menopause, such as hot flashes and night sweats. Hormone replacement carries certain risks, especially as you become older. If you are thinking about using estrogen or estrogen with progestin treatments, discuss the benefits and risks with your health care provider. What should I know about heart disease and stroke? Heart disease, heart attack, and stroke become more likely as you age. This may be due, in part, to the hormonal changes that your body experiences during menopause. These can affect how your body processes dietary fats, triglycerides, and cholesterol. Heart attack and stroke are both medical emergencies. There are many things that you can do to help prevent heart disease  and stroke:  Have your blood pressure checked at least every 1-2 years. High blood pressure causes heart disease and increases the risk of stroke.  If you are 34-48 years old, ask your health care provider if you should take aspirin to prevent a heart attack or a stroke.  Do not use any tobacco products, including cigarettes, chewing tobacco, or electronic cigarettes. If you need help quitting, ask your health care provider.  It is important to eat a healthy diet and maintain a healthy weight. ? Be sure to include plenty of vegetables, fruits, low-fat dairy products, and lean protein. ? Avoid eating foods that are high in solid fats, added sugars, or salt (sodium).  Get regular exercise. This is one of the most important things that you can do for your health. ? Try to exercise for at least 150 minutes each week. The type of exercise that you do should increase your heart rate and make you sweat. This is known as moderate-intensity exercise. ? Try to do strengthening exercises at least twice each week. Do these in addition to the moderate-intensity exercise.  Know your numbers.Ask your health care provider to check your cholesterol and your blood glucose. Continue to have your blood tested as directed by your health care provider.  What should I know about cancer screening? There are several types of cancer. Take the following steps to reduce your risk and to catch any cancer development as early as possible. Breast Cancer  Practice breast self-awareness. ? This means understanding how your breasts normally appear and feel. ? It also means doing regular breast self-exams. Let your health care provider know about any changes, no matter how small.  If you are 40 or  older, have a clinician do a breast exam (clinical breast exam or CBE) every year. Depending on your age, family history, and medical history, it may be recommended that you also have a yearly breast X-ray (mammogram).  If you  have a family history of breast cancer, talk with your health care provider about genetic screening.  If you are at high risk for breast cancer, talk with your health care provider about having an MRI and a mammogram every year.  Breast cancer (BRCA) gene test is recommended for women who have family members with BRCA-related cancers. Results of the assessment will determine the need for genetic counseling and BRCA1 and for BRCA2 testing. BRCA-related cancers include these types: ? Breast. This occurs in males or females. ? Ovarian. ? Tubal. This may also be called fallopian tube cancer. ? Cancer of the abdominal or pelvic lining (peritoneal cancer). ? Prostate. ? Pancreatic. Cervical, Uterine, and Ovarian Cancer Your health care provider may recommend that you be screened regularly for cancer of the pelvic organs. These include your ovaries, uterus, and vagina. This screening involves a pelvic exam, which includes checking for microscopic changes to the surface of your cervix (Pap test).  For women ages 21-65, health care providers may recommend a pelvic exam and a Pap test every three years. For women ages 39-65, they may recommend the Pap test and pelvic exam, combined with testing for human papilloma virus (HPV), every five years. Some types of HPV increase your risk of cervical cancer. Testing for HPV may also be done on women of any age who have unclear Pap test results.  Other health care providers may not recommend any screening for nonpregnant women who are considered low risk for pelvic cancer and have no symptoms. Ask your health care provider if a screening pelvic exam is right for you.  If you have had past treatment for cervical cancer or a condition that could lead to cancer, you need Pap tests and screening for cancer for at least 20 years after your treatment. If Pap tests have been discontinued for you, your risk factors (such as having a new sexual partner) need to be reassessed  to determine if you should start having screenings again. Some women have medical problems that increase the chance of getting cervical cancer. In these cases, your health care provider may recommend that you have screening and Pap tests more often.  If you have a family history of uterine cancer or ovarian cancer, talk with your health care provider about genetic screening.  If you have vaginal bleeding after reaching menopause, tell your health care provider.  There are currently no reliable tests available to screen for ovarian cancer. Lung Cancer Lung cancer screening is recommended for adults 57-50 years old who are at high risk for lung cancer because of a history of smoking. A yearly low-dose CT scan of the lungs is recommended if you:  Currently smoke.  Have a history of at least 30 pack-years of smoking and you currently smoke or have quit within the past 15 years. A pack-year is smoking an average of one pack of cigarettes per day for one year. Yearly screening should:  Continue until it has been 15 years since you quit.  Stop if you develop a health problem that would prevent you from having lung cancer treatment. Colorectal Cancer  This type of cancer can be detected and can often be prevented.  Routine colorectal cancer screening usually begins at age 12 and continues through  age 71.  If you have risk factors for colon cancer, your health care provider may recommend that you be screened at an earlier age.  If you have a family history of colorectal cancer, talk with your health care provider about genetic screening.  Your health care provider may also recommend using home test kits to check for hidden blood in your stool.  A small camera at the end of a tube can be used to examine your colon directly (sigmoidoscopy or colonoscopy). This is done to check for the earliest forms of colorectal cancer.  Direct examination of the colon should be repeated every 5-10 years until  age 63. However, if early forms of precancerous polyps or small growths are found or if you have a family history or genetic risk for colorectal cancer, you may need to be screened more often. Skin Cancer  Check your skin from head to toe regularly.  Monitor any moles. Be sure to tell your health care provider: ? About any new moles or changes in moles, especially if there is a change in a mole's shape or color. ? If you have a mole that is larger than the size of a pencil eraser.  If any of your family members has a history of skin cancer, especially at a young age, talk with your health care provider about genetic screening.  Always use sunscreen. Apply sunscreen liberally and repeatedly throughout the day.  Whenever you are outside, protect yourself by wearing long sleeves, pants, a wide-brimmed hat, and sunglasses. What should I know about osteoporosis? Osteoporosis is a condition in which bone destruction happens more quickly than new bone creation. After menopause, you may be at an increased risk for osteoporosis. To help prevent osteoporosis or the bone fractures that can happen because of osteoporosis, the following is recommended:  If you are 71-1 years old, get at least 1,000 mg of calcium and at least 600 mg of vitamin D per day.  If you are older than age 75 but younger than age 19, get at least 1,200 mg of calcium and at least 600 mg of vitamin D per day.  If you are older than age 73, get at least 1,200 mg of calcium and at least 800 mg of vitamin D per day. Smoking and excessive alcohol intake increase the risk of osteoporosis. Eat foods that are rich in calcium and vitamin D, and do weight-bearing exercises several times each week as directed by your health care provider. What should I know about how menopause affects my mental health? Depression may occur at any age, but it is more common as you become older. Common symptoms of depression include:  Low or sad  mood.  Changes in sleep patterns.  Changes in appetite or eating patterns.  Feeling an overall lack of motivation or enjoyment of activities that you previously enjoyed.  Frequent crying spells. Talk with your health care provider if you think that you are experiencing depression. What should I know about immunizations? It is important that you get and maintain your immunizations. These include:  Tetanus, diphtheria, and pertussis (Tdap) booster vaccine.  Influenza every year before the flu season begins.  Pneumonia vaccine.  Shingles vaccine. Your health care provider may also recommend other immunizations. This information is not intended to replace advice given to you by your health care provider. Make sure you discuss any questions you have with your health care provider. Document Released: 07/18/2005 Document Revised: 12/14/2015 Document Reviewed: 02/27/2015 Elsevier Interactive Patient Education  2019 [place].

## 2018-07-08 LAB — CYTOLOGY - PAP
Adequacy: ABSENT
Diagnosis: NEGATIVE
HPV (WINDOPATH): NOT DETECTED

## 2018-07-09 ENCOUNTER — Encounter: Payer: Self-pay | Admitting: Physician Assistant

## 2018-07-09 DIAGNOSIS — E785 Hyperlipidemia, unspecified: Secondary | ICD-10-CM | POA: Insufficient documentation

## 2018-07-09 NOTE — Progress Notes (Signed)
Call patient no abnormal cells were detected and HPV was negative. Follow up in 5 years for pap but annual for CPE.

## 2018-07-09 NOTE — Progress Notes (Signed)
Call pt: LDL is elevated at 160. Overall CV 10 year risk is still low at 1.6. working on low fat diet, exercise, and decreasing processed foods can help with this. Will check in 1 year. If continues to elevate I would suggest a cholesterol reducing medication.   Kidney, liver, glucose look great.

## 2018-08-02 DIAGNOSIS — F4322 Adjustment disorder with anxiety: Secondary | ICD-10-CM | POA: Diagnosis not present

## 2018-08-23 DIAGNOSIS — F4322 Adjustment disorder with anxiety: Secondary | ICD-10-CM | POA: Diagnosis not present

## 2018-09-06 DIAGNOSIS — F4322 Adjustment disorder with anxiety: Secondary | ICD-10-CM | POA: Diagnosis not present

## 2018-09-20 DIAGNOSIS — F4322 Adjustment disorder with anxiety: Secondary | ICD-10-CM | POA: Diagnosis not present

## 2018-10-20 DIAGNOSIS — F4322 Adjustment disorder with anxiety: Secondary | ICD-10-CM | POA: Diagnosis not present

## 2018-11-15 DIAGNOSIS — F4322 Adjustment disorder with anxiety: Secondary | ICD-10-CM | POA: Diagnosis not present

## 2018-12-27 DIAGNOSIS — F4322 Adjustment disorder with anxiety: Secondary | ICD-10-CM | POA: Diagnosis not present

## 2019-01-20 DIAGNOSIS — L03031 Cellulitis of right toe: Secondary | ICD-10-CM | POA: Diagnosis not present

## 2019-01-20 DIAGNOSIS — M722 Plantar fascial fibromatosis: Secondary | ICD-10-CM | POA: Diagnosis not present

## 2019-01-20 DIAGNOSIS — L600 Ingrowing nail: Secondary | ICD-10-CM | POA: Diagnosis not present

## 2019-02-08 DIAGNOSIS — F4322 Adjustment disorder with anxiety: Secondary | ICD-10-CM | POA: Diagnosis not present

## 2019-03-07 ENCOUNTER — Ambulatory Visit: Payer: BLUE CROSS/BLUE SHIELD | Admitting: Physician Assistant

## 2019-03-10 ENCOUNTER — Ambulatory Visit: Payer: BLUE CROSS/BLUE SHIELD | Admitting: Physician Assistant

## 2019-03-29 ENCOUNTER — Ambulatory Visit: Payer: BC Managed Care – PPO | Admitting: Physician Assistant

## 2019-03-29 ENCOUNTER — Other Ambulatory Visit: Payer: Self-pay

## 2019-03-29 ENCOUNTER — Ambulatory Visit (INDEPENDENT_AMBULATORY_CARE_PROVIDER_SITE_OTHER): Payer: BC Managed Care – PPO | Admitting: Physician Assistant

## 2019-03-29 ENCOUNTER — Encounter: Payer: Self-pay | Admitting: Physician Assistant

## 2019-03-29 VITALS — BP 109/82 | HR 92 | Ht 70.0 in | Wt 220.0 lb

## 2019-03-29 DIAGNOSIS — B373 Candidiasis of vulva and vagina: Secondary | ICD-10-CM

## 2019-03-29 DIAGNOSIS — B3731 Acute candidiasis of vulva and vagina: Secondary | ICD-10-CM

## 2019-03-29 DIAGNOSIS — K644 Residual hemorrhoidal skin tags: Secondary | ICD-10-CM | POA: Insufficient documentation

## 2019-03-29 DIAGNOSIS — N898 Other specified noninflammatory disorders of vagina: Secondary | ICD-10-CM

## 2019-03-29 LAB — WET PREP FOR TRICH, YEAST, CLUE
MICRO NUMBER:: 1008461
Specimen Quality: ADEQUATE

## 2019-03-29 MED ORDER — FLUCONAZOLE 150 MG PO TABS: 150.0000 mg | ORAL_TABLET | Freq: Once | ORAL | 0 refills | Status: AC

## 2019-03-29 NOTE — Patient Instructions (Signed)
Will call with results.    Skin Tag, Adult  A skin tag (acrochordon) is a soft, extra growth of skin. Most skin tags are flesh-colored and rarely bigger than a pencil eraser. They commonly form near areas where there are folds in the skin, such as the armpit or groin. Skin tags are not dangerous, and they do not spread from person to person (are not contagious). You may have one skin tag or several. Skin tags do not require treatment. However, your health care provider may recommend removal of a skin tag if it:  Gets irritated from clothing.  Bleeds.  Is visible and unsightly. Your health care provider can remove skin tags with a simple surgical procedure or a procedure that involves freezing the skin tag. Follow these instructions at home:  Watch for any changes in your skin tag. A normal skin tag does not require any other special care at home.  Take over-the-counter and prescription medicines only as told by your health care provider.  Keep all follow-up visits as told by your health care provider. This is important. Contact a health care provider if:  You have a skin tag that: ? Becomes painful. ? Changes color. ? Bleeds. ? Swells.  You develop more skin tags. This information is not intended to replace advice given to you by your health care provider. Make sure you discuss any questions you have with your health care provider. Document Released: 06/10/2015 Document Revised: 05/08/2017 Document Reviewed: 06/10/2015 Elsevier Patient Education  2020 [employer].

## 2019-03-29 NOTE — Progress Notes (Signed)
Yeast infection. No BV. Diflucan has already been sent.

## 2019-03-29 NOTE — Progress Notes (Signed)
Subjective:    Patient ID: Mallory Cooke, female    DOB: [birth year], 53 y.o.   MRN: [id number]  HPI  Pt is a 53 yo female with vaginal itching and discharge for the last week or so. She had to use a plastic tampon applicator instead of cardboard and feels vaginally irritated. No odor but does have hx of BV. She admits to using a new soap. No pain with urination, abdominal pain, flank pain, fever, or chills.   She has felt a mass on anus when she is wiping and wants it checked out. No pain.   .. Active Ambulatory Problems    Diagnosis Date Noted  . Depression 09/19/2014  . Generalized anxiety disorder 09/19/2014  . Migraine with aura and without status migrainosus, not intractable 01/28/2016  . History of hematuria 01/29/2016  . Asthma 03/11/2017  . Crushing injury of right hand 11/20/2017  . Right hand pain 11/20/2017  . Dyslipidemia (high LDL; low HDL) 07/09/2018  . Skin tag of anus 03/29/2019   Resolved Ambulatory Problems    Diagnosis Date Noted  . Abdominal discomfort 09/19/2014  . CN (constipation) 09/19/2014  . Acute pain of right foot 01/29/2016   Past Medical History:  Diagnosis Date  . Anxiety       Review of Systems See HPI.     Objective:   Physical Exam Vitals signs reviewed.  Constitutional:      Appearance: Normal appearance.  Cardiovascular:     Rate and Rhythm: Normal rate and regular rhythm.     Pulses: Normal pulses.  Pulmonary:     Effort: Pulmonary effort is normal.     Breath sounds: Normal breath sounds.  Abdominal:     General: Bowel sounds are normal.     Palpations: Abdomen is soft.     Tenderness: There is no abdominal tenderness.  Genitourinary:    General: Normal vulva.     Vagina: Vaginal discharge present.     Comments: White thick discharge.   Pedunculated flesh colored papule on anus.  Neurological:     General: No focal deficit present.     Mental Status: She is alert.  Psychiatric:        Mood and Affect: Mood normal.         Behavior: Behavior normal.           Assessment & Plan:  Marland KitchenMarland KitchenDiagnoses and all orders for this visit:  Vaginal discharge -     WET PREP FOR TRICH, YEAST, CLUE -     fluconazole (DIFLUCAN) 150 MG tablet; Take 1 tablet (150 mg total) by mouth once for 1 dose. Repeat in 48-72 hours if symptoms persist.  Vaginal irritation -     WET PREP FOR TRICH, YEAST, CLUE -     fluconazole (DIFLUCAN) 150 MG tablet; Take 1 tablet (150 mg total) by mouth once for 1 dose. Repeat in 48-72 hours if symptoms persist.  Skin tag of anus  Vaginal yeast infection -     fluconazole (DIFLUCAN) 150 MG tablet; Take 1 tablet (150 mg total) by mouth once for 1 dose. Repeat in 48-72 hours if symptoms persist.   .. Results for orders placed or performed in visit on 03/29/19  WET PREP FOR [place], YEAST, CLUE  Result Value Ref Range   MICRO NUMBER: [id number]    Specimen Quality Adequate    SOURCE: VAGINAL    Status FINAL    RESULT yeast    Confirmed yeast infection. Sent diflucan. Follow  up as needed.   Reassured patient about skin tag on rectum. Will senf for removal if would like.

## 2019-04-03 ENCOUNTER — Encounter: Payer: Self-pay | Admitting: Physician Assistant

## 2019-04-06 DIAGNOSIS — F4322 Adjustment disorder with anxiety: Secondary | ICD-10-CM | POA: Diagnosis not present

## 2019-04-27 ENCOUNTER — Encounter: Payer: Self-pay | Admitting: Physician Assistant

## 2019-04-27 DIAGNOSIS — F4322 Adjustment disorder with anxiety: Secondary | ICD-10-CM | POA: Diagnosis not present

## 2019-04-27 MED ORDER — FLUCONAZOLE 150 MG PO TABS: 150.0000 mg | ORAL_TABLET | Freq: Once | ORAL | 0 refills | Status: AC

## 2019-05-12 ENCOUNTER — Encounter: Payer: Self-pay | Admitting: Physician Assistant

## 2019-05-12 DIAGNOSIS — Z1211 Encounter for screening for malignant neoplasm of colon: Secondary | ICD-10-CM

## 2019-05-12 DIAGNOSIS — K644 Residual hemorrhoidal skin tags: Secondary | ICD-10-CM

## 2019-06-16 ENCOUNTER — Other Ambulatory Visit: Payer: Self-pay | Admitting: Physician Assistant

## 2019-06-16 DIAGNOSIS — F330 Major depressive disorder, recurrent, mild: Secondary | ICD-10-CM

## 2019-06-17 ENCOUNTER — Encounter: Payer: Self-pay | Admitting: Internal Medicine

## 2019-07-05 ENCOUNTER — Other Ambulatory Visit (HOSPITAL_COMMUNITY): Admission: RE | Admit: 2019-07-05 | Payer: BC Managed Care – PPO | Source: Ambulatory Visit

## 2019-07-05 ENCOUNTER — Encounter: Payer: Self-pay | Admitting: Advanced Practice Midwife

## 2019-07-05 ENCOUNTER — Ambulatory Visit: Payer: BC Managed Care – PPO | Admitting: Advanced Practice Midwife

## 2019-07-05 ENCOUNTER — Other Ambulatory Visit: Payer: Self-pay

## 2019-07-05 VITALS — BP 128/81 | HR 81 | Ht 70.0 in | Wt 227.0 lb

## 2019-07-05 DIAGNOSIS — N898 Other specified noninflammatory disorders of vagina: Secondary | ICD-10-CM | POA: Diagnosis not present

## 2019-07-05 DIAGNOSIS — Z113 Encounter for screening for infections with a predominantly sexual mode of transmission: Secondary | ICD-10-CM

## 2019-07-05 DIAGNOSIS — K644 Residual hemorrhoidal skin tags: Secondary | ICD-10-CM

## 2019-07-05 MED ORDER — CLOBETASOL PROPIONATE 0.05 % EX OINT: 1.0000 "application " | TOPICAL_OINTMENT | Freq: Two times a day (BID) | CUTANEOUS | 0 refills | Status: DC

## 2019-07-05 NOTE — Progress Notes (Signed)
  GYNECOLOGY PROGRESS NOTE  History:  54 y.o. G1P0010 presents to [facility] office today for problem gyn visit. She reports vaginal discomfort/irritation and anal skin tag.  She was told she had yeast by her primary care provider but is still having symptoms after Diflucan and Monistat.  She denies any unusual discharge or odor.  She is not sexually active with last intercourse 1 year ago.  She has regular monthly periods.  She denies h/a, dizziness, shortness of breath, n/v, or fever/chills.    The following portions of the patient's history were reviewed and updated as appropriate: allergies, current medications, past family history, past medical history, past social history, past surgical history and problem list. Last pap smear on 07/07/2018 was normal, negative HRHPV.  Review of Systems:  Pertinent items are noted in HPI.   Objective:  Physical Exam Blood pressure 128/81, pulse 81, height 5\' 10"  (1.778 m), weight 227 lb (103 kg), last menstrual period 06/25/2019. VS reviewed, nursing note reviewed,  Constitutional: well developed, well nourished, no distress HEENT: normocephalic CV: normal rate Pulm/chest wall: normal effort Breast Exam: deferred Abdomen: soft Neuro: alert and oriented x 3 Skin: warm, dry Psych: affect normal Pelvic exam: Visual inspection reveals mild erythema of perineal area, no edema, no abnormal discharge.  Assessment & Plan:  1. Vaginal discharge --Pt reports some light discharge but nothing different than usual.  She did notice more sudsy-ness/soapiness in the vaginal area when showering recently.    - Cervicovaginal ancillary only( [place])  2. Vaginal irritation --She reports feeling "twinges" that are not exactly painful but are uncomfortable that occur sometimes. She is concerned that something could be wrong.   3. Anal skin tag --Pt reports skin tag in anal area.  There is no pain and no bleeding.  The skin tag is bothering her however,  and she does not want her husband to see her "down there" with the skin tag.  She wants it removed. --Flesh colored smooth finger-like elongated stalk of tissue, approximately 1 cm x 0.25 cm protruding from anus. No pain to palpation. --Consult Dr 06/27/2019, who recommends referring pt to general surgery for removal.  - Ambulatory referral to General Surgery   Adrian Blackwater, CNM 3:12 PM

## 2019-07-05 NOTE — Progress Notes (Signed)
Pt states she has increased discharge recently and has a skin tag she would like looked at

## 2019-07-07 LAB — CERVICOVAGINAL ANCILLARY ONLY
Bacterial Vaginitis (gardnerella): NEGATIVE
Candida Glabrata: NEGATIVE
Candida Vaginitis: NEGATIVE
Chlamydia: NEGATIVE
Comment: NEGATIVE
Comment: NEGATIVE
Comment: NEGATIVE
Comment: NEGATIVE
Comment: NEGATIVE
Comment: NORMAL
Neisseria Gonorrhea: NEGATIVE
Trichomonas: NEGATIVE

## 2019-07-11 ENCOUNTER — Ambulatory Visit: Payer: BC Managed Care – PPO | Admitting: Physician Assistant

## 2019-07-11 ENCOUNTER — Encounter: Payer: Self-pay | Admitting: Physician Assistant

## 2019-07-11 ENCOUNTER — Other Ambulatory Visit: Payer: Self-pay

## 2019-07-11 VITALS — BP 123/83 | HR 96 | Ht 70.0 in | Wt 222.0 lb

## 2019-07-11 DIAGNOSIS — N750 Cyst of Bartholin's gland: Secondary | ICD-10-CM | POA: Diagnosis not present

## 2019-07-11 NOTE — Patient Instructions (Signed)
Bartholin's Cyst  A Bartholin's cyst is a fluid-filled sac that forms on a Bartholin's gland. Bartholin's glands are small glands in the folds of skin around the opening of the vagina (labia). This type of cyst causes a bulge or lump near the lower opening of the vagina. If you have a cyst that is small and not infected, you may be able to take care of it at home. If your cyst gets infected, it may cause pain and your doctor may need to drain it. An infected Bartholin's cyst is called a Bartholin's abscess. Follow these instructions at home: Medicines  Take over-the-counter and prescription medicines only as told by your doctor.  If you were prescribed an antibiotic medicine, take it as told by your doctor. Do not stop taking the antibiotic even if you start to feel better. Managing pain and swelling  Try sitz baths to help with pain and swelling. A sitz bath is a warm water bath in which the water only comes up to your hips and should cover your buttocks. You may take sitz baths a few times a day.  Put heat on the affected area as often as needed. Use the heat source that your doctor recommends, such as a moist heat pack or a heating pad. ? Place a towel between your skin and the heat source. ? Leave the heat on for 20-30 minutes. ? Remove the heat if your skin turns bright red. This is especially important if you cannot feel pain, heat, or cold. You may have a greater risk of getting burned. General instructions  If your cyst or abscess was drained: ? Follow instructions from your doctor about how to take care of your wound. ? Use feminine pads to absorb any fluid.  Do not push on or squeeze your cyst.  Do not have sex until the cyst has gone away or your wound from drainage has healed.  Take these steps to help prevent a Bartholin's cyst from returning, and to prevent other Bartholin's cysts from forming: ? Take a bath or shower once a day. Clean your vaginal area with mild soap and  water when you bathe. ? Practice safe sex to prevent STIs (sexually transmitted infections). Talk with your doctor about how to prevent STIs and which forms of birth control (contraception) may be best for you.  Keep all follow-up visits as told by your doctor. This is important. Contact a doctor if:  You have a fever.  You get redness, swelling, or pain around your cyst.  You have fluid, blood, pus, or a bad smell coming from your cyst.  You have a cyst that gets larger or comes back. Summary  A Bartholin's cyst is a fluid-filled sac that forms on a Bartholin's gland. These small glands are found in the folds of skin around the opening of the vagina (labia).  This type of cyst causes a bulge or lump near the lower opening of the vagina. An infected Bartholin's cyst is called a Bartholin's abscess.  Try sitz baths a few times a day to help with pain and swelling.  Do not push on or squeeze your cyst. This information is not intended to replace advice given to you by your health care provider. Make sure you discuss any questions you have with your health care provider. Document Revised: 03/18/2018 Document Reviewed: 02/25/2017 Elsevier Patient Education  2020 Elsevier Inc.  
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
## 2019-07-16 ENCOUNTER — Encounter: Payer: Self-pay | Admitting: Physician Assistant

## 2019-07-18 ENCOUNTER — Telehealth: Payer: Self-pay | Admitting: Physician Assistant

## 2019-07-18 NOTE — Telephone Encounter (Signed)
There is not a closer labuer group but we could place another referral for digestive health in [place].

## 2019-07-18 NOTE — Telephone Encounter (Signed)
Mithra advised. Referral has been switched to [facility].

## 2019-07-18 NOTE — Telephone Encounter (Signed)
Patient called, and she has an appointment with [facility] at [address] [place] in [place], [place] [address], this afternoon for a pre-op for her colonoscopy and was questioning if there is a closer [facility] to her here in [place] that she could go to instead, and would like a call back in regards to this. Please Advise.

## 2019-07-29 ENCOUNTER — Encounter: Payer: BC Managed Care – PPO | Admitting: Internal Medicine

## 2019-09-27 ENCOUNTER — Encounter: Payer: Self-pay | Admitting: Physician Assistant

## 2019-09-27 NOTE — Telephone Encounter (Signed)
Appointment has been made. No further questions at this time.  

## 2019-09-28 ENCOUNTER — Other Ambulatory Visit: Payer: Self-pay | Admitting: Physician Assistant

## 2019-09-28 ENCOUNTER — Ambulatory Visit (INDEPENDENT_AMBULATORY_CARE_PROVIDER_SITE_OTHER): Payer: BC Managed Care – PPO

## 2019-09-28 ENCOUNTER — Other Ambulatory Visit: Payer: Self-pay

## 2019-09-28 ENCOUNTER — Ambulatory Visit (INDEPENDENT_AMBULATORY_CARE_PROVIDER_SITE_OTHER): Payer: BC Managed Care – PPO | Admitting: Sports Medicine

## 2019-09-28 DIAGNOSIS — S99912A Unspecified injury of left ankle, initial encounter: Secondary | ICD-10-CM

## 2019-09-28 DIAGNOSIS — S9032XA Contusion of left foot, initial encounter: Secondary | ICD-10-CM

## 2019-09-28 DIAGNOSIS — S99922A Unspecified injury of left foot, initial encounter: Secondary | ICD-10-CM | POA: Diagnosis not present
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
## 2019-10-07 ENCOUNTER — Ambulatory Visit: Payer: BC Managed Care – PPO | Admitting: Sports Medicine

## 2019-11-22 ENCOUNTER — Encounter: Payer: Self-pay | Admitting: Physician Assistant

## 2019-11-22 MED ORDER — ALBUTEROL SULFATE HFA 108 (90 BASE) MCG/ACT IN AERS: 2.0000 | INHALATION_SPRAY | Freq: Four times a day (QID) | RESPIRATORY_TRACT | 1 refills | Status: AC | PRN

## 2019-11-22 NOTE — Telephone Encounter (Signed)
Doesn't look like this has ever been prescribed by our office. Last physical January 2020. Please advise.

## 2020-01-01 ENCOUNTER — Encounter: Payer: Self-pay | Admitting: Physician Assistant

## 2020-02-17 ENCOUNTER — Encounter: Payer: Self-pay | Admitting: Physician Assistant

## 2020-07-12 ENCOUNTER — Other Ambulatory Visit: Payer: Self-pay | Admitting: Physician Assistant

## 2020-07-12 DIAGNOSIS — F330 Major depressive disorder, recurrent, mild: Secondary | ICD-10-CM

## 2020-07-25 ENCOUNTER — Encounter: Payer: Self-pay | Admitting: Physician Assistant

## 2020-07-25 DIAGNOSIS — F330 Major depressive disorder, recurrent, mild: Secondary | ICD-10-CM

## 2020-07-25 MED ORDER — BUPROPION HCL 100 MG PO TABS: 100.0000 mg | ORAL_TABLET | Freq: Two times a day (BID) | ORAL | 0 refills | Status: DC

## 2020-07-25 MED ORDER — SERTRALINE HCL 50 MG PO TABS: 50.0000 mg | ORAL_TABLET | Freq: Every day | ORAL | 0 refills | Status: DC

## 2020-07-31 ENCOUNTER — Ambulatory Visit: Payer: BC Managed Care – PPO | Admitting: Physician Assistant

## 2020-08-15 ENCOUNTER — Ambulatory Visit: Payer: BC Managed Care – PPO | Admitting: Physician Assistant

## 2020-08-22 ENCOUNTER — Ambulatory Visit: Payer: BC Managed Care – PPO | Admitting: Physician Assistant

## 2020-08-29 ENCOUNTER — Ambulatory Visit: Payer: BC Managed Care – PPO | Admitting: Physician Assistant

## 2020-10-24 ENCOUNTER — Ambulatory Visit: Payer: BC Managed Care – PPO | Admitting: Physician Assistant

## 2020-10-24 ENCOUNTER — Encounter: Payer: Self-pay | Admitting: Physician Assistant

## 2020-10-24 ENCOUNTER — Other Ambulatory Visit: Payer: Self-pay

## 2020-10-24 VITALS — BP 121/64 | HR 70 | Ht 70.0 in | Wt 220.0 lb

## 2020-10-24 DIAGNOSIS — F411 Generalized anxiety disorder: Secondary | ICD-10-CM

## 2020-10-24 DIAGNOSIS — F330 Major depressive disorder, recurrent, mild: Secondary | ICD-10-CM

## 2020-10-24 DIAGNOSIS — Z1231 Encounter for screening mammogram for malignant neoplasm of breast: Secondary | ICD-10-CM

## 2020-10-24 DIAGNOSIS — Z131 Encounter for screening for diabetes mellitus: Secondary | ICD-10-CM

## 2020-10-24 DIAGNOSIS — Z0000 Encounter for general adult medical examination without abnormal findings: Secondary | ICD-10-CM

## 2020-10-24 DIAGNOSIS — Z79899 Other long term (current) drug therapy: Secondary | ICD-10-CM | POA: Diagnosis not present

## 2020-10-24 DIAGNOSIS — Z1329 Encounter for screening for other suspected endocrine disorder: Secondary | ICD-10-CM | POA: Diagnosis not present

## 2020-10-24 DIAGNOSIS — Z1159 Encounter for screening for other viral diseases: Secondary | ICD-10-CM

## 2020-10-24 DIAGNOSIS — E785 Hyperlipidemia, unspecified: Secondary | ICD-10-CM

## 2020-10-24 DIAGNOSIS — Z1211 Encounter for screening for malignant neoplasm of colon: Secondary | ICD-10-CM

## 2020-10-24 MED ORDER — SERTRALINE HCL 50 MG PO TABS: 50.0000 mg | ORAL_TABLET | Freq: Every day | ORAL | 3 refills | Status: DC

## 2020-10-24 MED ORDER — BUPROPION HCL 100 MG PO TABS: 100.0000 mg | ORAL_TABLET | Freq: Two times a day (BID) | ORAL | 3 refills | Status: DC

## 2020-10-24 NOTE — Patient Instructions (Signed)
Health Maintenance, Female Adopting a healthy lifestyle and getting preventive care are important in promoting health and wellness. Ask your health care provider about:  The right schedule for you to have regular tests and exams.  Things you can do on your own to prevent diseases and keep yourself healthy. What should I know about diet, weight, and exercise? Eat a healthy diet  Eat a diet that includes plenty of vegetables, fruits, low-fat dairy products, and lean protein.  Do not eat a lot of foods that are high in solid fats, added sugars, or sodium.   Maintain a healthy weight Body mass index (BMI) is used to identify weight problems. It estimates body fat based on height and weight. Your health care provider can help determine your BMI and help you achieve or maintain a healthy weight. Get regular exercise Get regular exercise. This is one of the most important things you can do for your health. Most adults should:  Exercise for at least 150 minutes each week. The exercise should increase your heart rate and make you sweat (moderate-intensity exercise).  Do strengthening exercises at least twice a week. This is in addition to the moderate-intensity exercise.  Spend less time sitting. Even light physical activity can be beneficial. Watch cholesterol and blood lipids Have your blood tested for lipids and cholesterol at 53 years of age, then have this test every 5 years. Have your cholesterol levels checked more often if:  Your lipid or cholesterol levels are high.  You are older than 53 years of age.  You are at high risk for heart disease. What should I know about cancer screening? Depending on your health history and family history, you may need to have cancer screening at various ages. This may include screening for:  Breast cancer.  Cervical cancer.  Colorectal cancer.  Skin cancer.  Lung cancer. What should I know about heart disease, diabetes, and high blood  pressure? Blood pressure and heart disease  High blood pressure causes heart disease and increases the risk of stroke. This is more likely to develop in people who have high blood pressure readings, are of African descent, or are overweight.  Have your blood pressure checked: ? Every 3-5 years if you are 18-39 years of age. ? Every year if you are 40 years old or older. Diabetes Have regular diabetes screenings. This checks your fasting blood sugar level. Have the screening done:  Once every three years after age 40 if you are at a normal weight and have a low risk for diabetes.  More often and at a younger age if you are overweight or have a high risk for diabetes. What should I know about preventing infection? Hepatitis B If you have a higher risk for hepatitis B, you should be screened for this virus. Talk with your health care provider to find out if you are at risk for hepatitis B infection. Hepatitis C Testing is recommended for:  Everyone born from 1945 through 1965.  Anyone with known risk factors for hepatitis C. Sexually transmitted infections (STIs)  Get screened for STIs, including gonorrhea and chlamydia, if: ? You are sexually active and are younger than 53 years of age. ? You are older than 53 years of age and your health care provider tells you that you are at risk for this type of infection. ? Your sexual activity has changed since you were last screened, and you are at increased risk for chlamydia or gonorrhea. Ask your health care provider   if you are at risk.  Ask your health care provider about whether you are at high risk for HIV. Your health care provider may recommend a prescription medicine to help prevent HIV infection. If you choose to take medicine to prevent HIV, you should first get tested for HIV. You should then be tested every 3 months for as long as you are taking the medicine. Pregnancy  If you are about to stop having your period (premenopausal) and  you may become pregnant, seek counseling before you get pregnant.  Take 400 to 800 micrograms (mcg) of folic acid every day if you become pregnant.  Ask for birth control (contraception) if you want to prevent pregnancy. Osteoporosis and menopause Osteoporosis is a disease in which the bones lose minerals and strength with aging. This can result in bone fractures. If you are 65 years old or older, or if you are at risk for osteoporosis and fractures, ask your health care provider if you should:  Be screened for bone loss.  Take a calcium or vitamin D supplement to lower your risk of fractures.  Be given hormone replacement therapy (HRT) to treat symptoms of menopause. Follow these instructions at home: Lifestyle  Do not use any products that contain nicotine or tobacco, such as cigarettes, e-cigarettes, and chewing tobacco. If you need help quitting, ask your health care provider.  Do not use street drugs.  Do not share needles.  Ask your health care provider for help if you need support or information about quitting drugs. Alcohol use  Do not drink alcohol if: ? Your health care provider tells you not to drink. ? You are pregnant, may be pregnant, or are planning to become pregnant.  If you drink alcohol: ? Limit how much you use to 0-1 drink a day. ? Limit intake if you are breastfeeding.  Be aware of how much alcohol is in your drink. In the [place] one drink equals one 12 oz bottle of beer (355 mL), one 5 oz glass of wine (148 mL), or one 1 oz glass of hard liquor (44 mL). General instructions  Schedule regular health, dental, and eye exams.  Stay current with your vaccines.  Tell your health care provider if: ? You often feel depressed. ? You have ever been abused or do not feel safe at home. Summary  Adopting a healthy lifestyle and getting preventive care are important in promoting health and wellness.  Follow your health care provider's instructions about healthy  diet, exercising, and getting tested or screened for diseases.  Follow your health care provider's instructions on monitoring your cholesterol and blood pressure. This information is not intended to replace advice given to you by your health care provider. Make sure you discuss any questions you have with your health care provider. Document Revised: 05/19/2018 Document Reviewed: 05/19/2018 Elsevier Patient Education  2021 Elsevier Inc.  

## 2020-10-24 NOTE — Progress Notes (Signed)
Subjective:     Mallory Cooke is a 55 y.o. female and is here for a comprehensive physical exam. The patient reports no problems.  Social History   Socioeconomic History  . Marital status: Married    Spouse name: Not on file  . Number of children: Not on file  . Years of education: Not on file  . Highest education level: Not on file  Occupational History  . Not on file  Tobacco Use  . Smoking status: Never Smoker  . Smokeless tobacco: Never Used  Substance and Sexual Activity  . Alcohol use: No    Alcohol/week: 0.0 standard drinks  . Drug use: No  . Sexual activity: Not Currently    Birth control/protection: None  Other Topics Concern  . Not on file  Social History Narrative  . Not on file   Social Determinants of Health   Financial Resource Strain: Not on file  Food Insecurity: Not on file  Transportation Needs: Not on file  Physical Activity: Not on file  Stress: Not on file  Social Connections: Not on file  Intimate Partner Violence: Not on file   Health Maintenance  Topic Date Due  . Hepatitis C Screening  Never done  . COVID-19 Vaccine (1) 11/09/2020 (Originally 10[birth year]27/1979)  . MAMMOGRAM  10/24/2021 (Originally 07/07/2020)  . COLONOSCOPY (Pts 45-74yrs Insurance coverage will need to be confirmed)  10/24/2021 (Originally 10/26/2012)  . HIV Screening  10/24/2021 (Originally 10/27/1982)  . INFLUENZA VACCINE  05/31/2028 (Originally 01/07/2021)  . PAP SMEAR-Modifier  07/08/2023  . TETANUS/TDAP  07/07/2028  . HPV VACCINES  Aged Out    The following portions of the patient's history were reviewed and updated as appropriate: allergies, current medications, past family history, past medical history, past social history, past surgical history and problem list.  Review of Systems A comprehensive review of systems was negative.   Objective:    BP 121/64   Pulse 70   Ht 5\' 10"  (1.778 m)   Wt 220 lb (99.8 kg)   SpO2 100%   BMI 31.57 kg/m  General appearance: alert,  cooperative and appears stated age Head: Normocephalic, without obvious abnormality, atraumatic Eyes: conjunctivae/corneas clear. PERRL, EOM's intact. Fundi benign. Ears: normal TM's and external ear canals both ears Nose: Nares normal. Septum midline. Mucosa normal. No drainage or sinus tenderness. Throat: lips, mucosa, and tongue normal; teeth and gums normal Neck: no adenopathy, no carotid bruit, no JVD, supple, symmetrical, trachea midline and thyroid not enlarged, symmetric, no tenderness/mass/nodules Back: symmetric, no curvature. ROM normal. No CVA tenderness. Lungs: clear to auscultation bilaterally Heart: regular rate and rhythm, S1, S2 normal, no murmur, click, rub or gallop Abdomen: soft, non-tender; bowel sounds normal; no masses,  no organomegaly Extremities: extremities normal, atraumatic, no cyanosis or edema Pulses: 2+ and symmetric Skin: Skin color, texture, turgor normal. No rashes or lesions Lymph nodes: Cervical, supraclavicular, and axillary nodes normal. Neurologic: Alert and oriented X 3, normal strength and tone. Normal symmetric reflexes. Normal coordination and gait   . Depression screen [facility] 2/9 10/24/2020 07/07/2018 05/31/2018 03/11/2017 01/28/2016  Decreased Interest 1 0 1 0 0  Down, Depressed, Hopeless 1 1 0 1 1  PHQ - 2 Score 2 1 1 1 1   Altered sleeping 0 0 0 - 0  Tired, decreased energy 1 1 1  - 1  Change in appetite 1 0 0 - 0  Feeling bad or failure about yourself  1 0 2 - 0  Trouble concentrating 0 1 1 -  0  Moving slowly or fidgety/restless 0 0 1 - 0  Suicidal thoughts 0 0 0 - 0  PHQ-9 Score 5 3 6  - 2  Difficult doing work/chores Somewhat difficult Not difficult at all Not difficult at all - -   . GAD 7 : Generalized Anxiety Score 10/24/2020 07/07/2018 05/31/2018 01/28/2016  Nervous, Anxious, on Edge 2 1 1 1   Control/stop worrying 1 0 0 0  Worry too much - different things 1 1 1 1   Trouble relaxing 0 0 0 0  Restless 0 0 0 0  Easily annoyed or irritable  2 1 1  0  Afraid - awful might happen 1 0 0 1  Total GAD 7 Score 7 3 3 3   Anxiety Difficulty Somewhat difficult Not difficult at all Not difficult at all Not difficult at all     Assessment:    Healthy female exam.      Plan:  01/30/2016 Mallory Cooke was seen today for follow-up.  Diagnoses and all orders for this visit:  Routine physical examination  Medication management -     CBC with Differential/Platelet -     COMPLETE METABOLIC PANEL WITH GFR -     Lipid Panel w/reflex Direct LDL -     TSH  Dyslipidemia (high LDL; low HDL) -     Lipid Panel w/reflex Direct LDL  Thyroid disorder screen -     TSH  Diabetes mellitus screening -     COMPLETE METABOLIC PANEL WITH GFR  Mild episode of recurrent major depressive disorder (HCC) -     buPROPion (WELLBUTRIN) 100 MG tablet; Take 1 tablet (100 mg total) by mouth 2 (two) times daily. -     sertraline (ZOLOFT) 50 MG tablet; Take 1 tablet (50 mg total) by mouth daily.  Generalized anxiety disorder  Encounter for hepatitis C screening test for low risk patient -     Hepatitis C Antibody  Visit for screening mammogram -     MM 3D SCREEN BREAST BILATERAL  Colon cancer screening -     Ambulatory referral to Gastroenterology    . Discussed 150 minutes of exercise a week.  Encouraged vitamin D 1000 units and Calcium 1300mg  or 4 servings of dairy a day.  Fasting labs ordered.  PHQ/GAD numbers up but patient is having stress at work. Declines any medication adjustments. Continue on zoloft and wellbutrin.  Mammogram ordered.  Pap UTD.  Colonoscopy referred.  Declined shingles/flu/covid vaccine.  Follow up in 1 year.    See After Visit Summary for Counseling Recommendations

## 2021-01-31 ENCOUNTER — Other Ambulatory Visit: Payer: Self-pay

## 2021-01-31 ENCOUNTER — Ambulatory Visit (INDEPENDENT_AMBULATORY_CARE_PROVIDER_SITE_OTHER): Payer: BC Managed Care – PPO

## 2021-01-31 DIAGNOSIS — Z1159 Encounter for screening for other viral diseases: Secondary | ICD-10-CM | POA: Diagnosis not present

## 2021-01-31 DIAGNOSIS — Z1231 Encounter for screening mammogram for malignant neoplasm of breast: Secondary | ICD-10-CM

## 2021-01-31 DIAGNOSIS — Z131 Encounter for screening for diabetes mellitus: Secondary | ICD-10-CM | POA: Diagnosis not present

## 2021-01-31 DIAGNOSIS — Z79899 Other long term (current) drug therapy: Secondary | ICD-10-CM | POA: Diagnosis not present

## 2021-01-31 DIAGNOSIS — E785 Hyperlipidemia, unspecified: Secondary | ICD-10-CM | POA: Diagnosis not present

## 2021-02-01 LAB — COMPLETE METABOLIC PANEL WITH GFR
AG Ratio: 1.9 (calc) (ref 1.0–2.5)
ALT: 15 U/L (ref 6–29)
AST: 14 U/L (ref 10–35)
Albumin: 4.1 g/dL (ref 3.6–5.1)
Alkaline phosphatase (APISO): 81 U/L (ref 37–153)
BUN/Creatinine Ratio: 9 (calc) (ref 6–22)
BUN: 6 mg/dL — ABNORMAL LOW (ref 7–25)
CO2: 28 mmol/L (ref 20–32)
Calcium: 9 mg/dL (ref 8.6–10.4)
Chloride: 105 mmol/L (ref 98–110)
Creat: 0.64 mg/dL (ref 0.50–1.03)
Globulin: 2.2 g/dL (calc) (ref 1.9–3.7)
Glucose, Bld: 101 mg/dL — ABNORMAL HIGH (ref 65–99)
Potassium: 3.9 mmol/L (ref 3.5–5.3)
Sodium: 140 mmol/L (ref 135–146)
Total Bilirubin: 0.6 mg/dL (ref 0.2–1.2)
Total Protein: 6.3 g/dL (ref 6.1–8.1)
eGFR: 106 mL/min/{1.73_m2} (ref >=60)

## 2021-02-01 LAB — CBC WITH DIFFERENTIAL/PLATELET
Absolute Monocytes: 400 cells/uL (ref 200–950)
Basophils Absolute: 21 cells/uL (ref 0–200)
Basophils Relative: 0.3 %
Eosinophils Absolute: 117 cells/uL (ref 15–500)
Eosinophils Relative: 1.7 %
HCT: 39.9 % (ref 35.0–45.0)
Hemoglobin: 13.7 g/dL (ref 11.7–15.5)
Lymphs Abs: 1511 cells/uL (ref 850–3900)
MCH: 30.6 pg (ref 27.0–33.0)
MCHC: 34.3 g/dL (ref 32.0–36.0)
MCV: 89.1 fL (ref 80.0–100.0)
MPV: 9.7 fL (ref 7.5–12.5)
Monocytes Relative: 5.8 %
Neutro Abs: 4851 cells/uL (ref 1500–7800)
Neutrophils Relative %: 70.3 %
Platelets: 282 10*3/uL (ref 140–400)
RBC: 4.48 10*6/uL (ref 3.80–5.10)
RDW: 12.4 % (ref 11.0–15.0)
Total Lymphocyte: 21.9 %
WBC: 6.9 10*3/uL (ref 3.8–10.8)

## 2021-02-01 LAB — LIPID PANEL W/REFLEX DIRECT LDL
Cholesterol: 250 mg/dL — ABNORMAL HIGH (ref <200)
HDL: 41 mg/dL — ABNORMAL LOW (ref >=50)
LDL Cholesterol (Calc): 183 mg/dL (calc) — ABNORMAL HIGH
Non-HDL Cholesterol (Calc): 209 mg/dL (calc) — ABNORMAL HIGH (ref <130)
Total CHOL/HDL Ratio: 6.1 (calc) — ABNORMAL HIGH (ref <5.0)
Triglycerides: 123 mg/dL (ref <150)

## 2021-02-01 LAB — HEPATITIS C ANTIBODY
Hepatitis C Ab: NONREACTIVE
SIGNAL TO CUT-OFF: 0.06 (ref <1.00)

## 2021-02-01 LAB — TSH: TSH: 1.38 mIU/L

## 2021-02-01 NOTE — Progress Notes (Signed)
Takelia,   Thyroid looks great.  Kidney, liver, glucose look good.  LDL worsened and very elevated. HDL still low. I suggest starting a cholesterol reducing medication. Are you ok with this?

## 2021-02-04 ENCOUNTER — Encounter: Payer: Self-pay | Admitting: Physician Assistant

## 2021-02-04 NOTE — Progress Notes (Signed)
Normal mammogram. Follow up in 1 year.

## 2021-03-29 ENCOUNTER — Other Ambulatory Visit: Payer: Self-pay

## 2021-03-29 ENCOUNTER — Ambulatory Visit: Payer: BC Managed Care – PPO | Admitting: Physician Assistant

## 2021-03-29 ENCOUNTER — Encounter: Payer: Self-pay | Admitting: Physician Assistant

## 2021-03-29 VITALS — BP 118/52 | HR 71 | Temp 98.7°F | Ht 70.0 in | Wt 221.0 lb

## 2021-03-29 DIAGNOSIS — K146 Glossodynia: Secondary | ICD-10-CM

## 2021-03-29 DIAGNOSIS — R220 Localized swelling, mass and lump, head: Secondary | ICD-10-CM

## 2021-03-29 DIAGNOSIS — M26621 Arthralgia of right temporomandibular joint: Secondary | ICD-10-CM

## 2021-03-29 MED ORDER — LIDOCAINE VISCOUS HCL 2 % MT SOLN: 15.0000 mL | OROMUCOSAL | 0 refills | Status: DC | PRN

## 2021-03-29 MED ORDER — METHYLPREDNISOLONE 4 MG PO TBPK: ORAL_TABLET | ORAL | 0 refills | Status: DC

## 2021-03-29 NOTE — Patient Instructions (Signed)
Will get labs.  Consider taking prednisone.  Use mouth wash as needed.

## 2021-03-29 NOTE — Progress Notes (Signed)
Subjective:    Patient ID: Mallory Cooke, female    DOB: [birth year], 55 y.o.   MRN: [id number]  HPI Pt is a 55 yo female who presents to the clinic with tongue swelling and burning for the last month. She went to her dentitist and was treated for thrush even though thrush was not seen. It has not helped. Taste has changed and not able to tolerate her sodas and can only drink water. Noticed symptoms occurred right after her dog passed away. No new medications, supplements, tooth paste or foods. She feels like her "tongue is big". No ST, headache, sinus pressure, SOB. Her right jaw is also sore.   .. Active Ambulatory Problems    Diagnosis Date Noted   Depression 09/19/2014   Generalized anxiety disorder 09/19/2014   Migraine with aura and without status migrainosus, not intractable 01/28/2016   History of hematuria 01/29/2016   Asthma 03/11/2017   Crushing injury of right hand 11/20/2017   Right hand pain 11/20/2017   Dyslipidemia (high LDL; low HDL) 07/09/2018   Skin tag of anus 03/29/2019   Bartholin cyst 07/11/2019   Left ankle injury 09/28/2019   Contusion of left heel 09/28/2019   Resolved Ambulatory Problems    Diagnosis Date Noted   Abdominal discomfort 09/19/2014   CN (constipation) 09/19/2014   Acute pain of right foot 01/29/2016   Past Medical History:  Diagnosis Date   Anxiety        Review of Systems See HPI.   Objective:   Physical Exam Vitals reviewed.  Constitutional:      Appearance: Normal appearance. She is obese.  HENT:     Head: Normocephalic.     Right Ear: Tympanic membrane, ear canal and external ear normal. There is no impacted cerumen.     Left Ear: Tympanic membrane and ear canal normal. There is no impacted cerumen.     Ears:     Comments: Right TMJ tenderness.  Normal ROM of jaw.     Nose: Nose normal.     Mouth/Throat:     Mouth: Mucous membranes are moist.     Pharynx: No oropharyngeal exudate or posterior oropharyngeal erythema.      Comments: Tongue appear normal size.  No ulcers/lesions/exudate/plaque Uvula midline No tonsilar swelling.  Neck:     Vascular: No carotid bruit.  Cardiovascular:     Rate and Rhythm: Normal rate and regular rhythm.  Pulmonary:     Effort: Pulmonary effort is normal.     Breath sounds: Normal breath sounds.  Lymphadenopathy:     Cervical: No cervical adenopathy.  Neurological:     General: No focal deficit present.     Mental Status: She is alert.  Psychiatric:        Mood and Affect: Mood normal.          Assessment & Plan:  Marland KitchenMarland KitchenModell was seen today for tongue burning.  Diagnoses and all orders for this visit:  Tongue swelling -     B12 and Folate Panel -     Zinc -     VITAMIN D 25 Hydroxy (Vit-D Deficiency, Fractures) -     Vitamin B6 -     Vitamin B1 -     lidocaine (XYLOCAINE) 2 % solution; Use as directed 15 mLs in the mouth or throat every 3 (three) hours as needed (mouth/throat pain - gargle and spit). -     methylPREDNISolone (MEDROL DOSEPAK) 4 MG TBPK tablet; Take as directed by  package insert. -     FSH/LH -     Estradiol -     FSH/LH  BMS (burning mouth syndrome) -     B12 and Folate Panel -     Zinc -     VITAMIN D 25 Hydroxy (Vit-D Deficiency, Fractures) -     Vitamin B6 -     Vitamin B1 -     lidocaine (XYLOCAINE) 2 % solution; Use as directed 15 mLs in the mouth or throat every 3 (three) hours as needed (mouth/throat pain - gargle and spit). -     methylPREDNISolone (MEDROL DOSEPAK) 4 MG TBPK tablet; Take as directed by package insert. -     FSH/LH -     Estradiol -     FSH/LH  TMJ tenderness, right -     methylPREDNISolone (MEDROL DOSEPAK) 4 MG TBPK tablet; Take as directed by package insert. -     FSH/LH  Unclear etiology. PE unremarkable. No evidence of thrush/ulcers/swelling/dry mouth.  Labs ordered for vitamin deficiency.  She does have some right sided TMJ tenderness.  Discussed trial of medrol dose pack.  Discussed red flag warnings.   Gave some numbing mouth wash to use as needed.  Follow up as needed or if not improving or symptoms changing.

## 2021-04-01 ENCOUNTER — Encounter: Payer: Self-pay | Admitting: Physician Assistant

## 2021-04-01 DIAGNOSIS — K146 Glossodynia: Secondary | ICD-10-CM | POA: Insufficient documentation

## 2021-04-01 DIAGNOSIS — M26621 Arthralgia of right temporomandibular joint: Secondary | ICD-10-CM | POA: Insufficient documentation

## 2021-04-01 DIAGNOSIS — R220 Localized swelling, mass and lump, head: Secondary | ICD-10-CM | POA: Insufficient documentation

## 2021-04-01 NOTE — Progress Notes (Signed)
Folate looks great.  B12 is low. Come in for first b12 shot and then supplement sublingual b12 daily recheck in 3 months.  Vitamin D just in normal range. Make sure taking at least 2000units a day.   Other labs pending.

## 2021-04-02 NOTE — Progress Notes (Signed)
FSH post menopausal  Zinc normal.  Labs still pending.

## 2021-04-03 LAB — B12 AND FOLATE PANEL
Folate: 14.9 ng/mL
Vitamin B-12: 239 pg/mL (ref 200–1100)

## 2021-04-03 LAB — VITAMIN B6: Vitamin B6: 6.4 ng/mL (ref 2.1–21.7)

## 2021-04-03 LAB — ZINC: Zinc: 74 ug/dL (ref 60–130)

## 2021-04-03 LAB — VITAMIN D 25 HYDROXY (VIT D DEFICIENCY, FRACTURES): Vit D, 25-Hydroxy: 32 ng/mL (ref 30–100)

## 2021-04-03 LAB — FSH/LH
FSH: 26 m[IU]/mL
LH: 26.8 m[IU]/mL

## 2021-04-03 LAB — VITAMIN B1: Vitamin B1 (Thiamine): 12 nmol/L (ref 8–30)

## 2021-04-04 DIAGNOSIS — F331 Major depressive disorder, recurrent, moderate: Secondary | ICD-10-CM | POA: Diagnosis not present

## 2021-04-04 NOTE — Progress Notes (Signed)
B6/B1 normal.

## 2021-04-18 DIAGNOSIS — F331 Major depressive disorder, recurrent, moderate: Secondary | ICD-10-CM | POA: Diagnosis not present

## 2021-04-25 DIAGNOSIS — F331 Major depressive disorder, recurrent, moderate: Secondary | ICD-10-CM | POA: Diagnosis not present

## 2021-04-29 ENCOUNTER — Encounter: Payer: Self-pay | Admitting: Physician Assistant

## 2021-05-07 DIAGNOSIS — F331 Major depressive disorder, recurrent, moderate: Secondary | ICD-10-CM | POA: Diagnosis not present

## 2021-05-14 DIAGNOSIS — F331 Major depressive disorder, recurrent, moderate: Secondary | ICD-10-CM | POA: Diagnosis not present

## 2021-05-14 NOTE — Telephone Encounter (Signed)
Received Epic notification that pt has not read message regarding appt for B12 injection.  Attempted to contact pt via telephone.  No answer and VM box was full.  Unable to leave a message.  Tiajuana Amass, CMA

## 2021-05-21 ENCOUNTER — Other Ambulatory Visit: Payer: Self-pay

## 2021-05-21 ENCOUNTER — Ambulatory Visit (INDEPENDENT_AMBULATORY_CARE_PROVIDER_SITE_OTHER): Payer: BC Managed Care – PPO | Admitting: Physician Assistant

## 2021-05-21 VITALS — BP 132/73 | HR 93 | Temp 96.9°F | Ht 70.0 in | Wt 221.6 lb

## 2021-05-21 DIAGNOSIS — D510 Vitamin B12 deficiency anemia due to intrinsic factor deficiency: Secondary | ICD-10-CM | POA: Diagnosis not present

## 2021-05-21 DIAGNOSIS — F331 Major depressive disorder, recurrent, moderate: Secondary | ICD-10-CM | POA: Diagnosis not present

## 2021-05-21 MED ORDER — CYANOCOBALAMIN 1000 MCG/ML IJ SOLN
1000.0000 ug | Freq: Once | INTRAMUSCULAR | Status: AC
  Administered 2021-05-21: 1000 ug via INTRAMUSCULAR

## 2021-05-21 NOTE — Progress Notes (Signed)
Agree with plan. Andre Gallego PA-C  

## 2021-05-21 NOTE — Progress Notes (Signed)
Pt is here for a vitamin B12 injection. Denies GI problems or dizziness.  Pt tolerated injection well without complications.   Pt advised to supplement B12 daily. Recheck labs in 3 months.

## 2021-05-28 DIAGNOSIS — F331 Major depressive disorder, recurrent, moderate: Secondary | ICD-10-CM | POA: Diagnosis not present

## 2021-06-04 DIAGNOSIS — F331 Major depressive disorder, recurrent, moderate: Secondary | ICD-10-CM | POA: Diagnosis not present

## 2021-06-21 ENCOUNTER — Encounter: Payer: Self-pay | Admitting: Physician Assistant

## 2021-06-21 ENCOUNTER — Ambulatory Visit: Payer: BC Managed Care – PPO | Admitting: Physician Assistant

## 2021-06-21 ENCOUNTER — Other Ambulatory Visit: Payer: Self-pay

## 2021-06-21 VITALS — BP 141/80 | HR 96 | Ht 70.0 in | Wt 218.0 lb

## 2021-06-21 DIAGNOSIS — K140 Glossitis: Secondary | ICD-10-CM

## 2021-06-21 DIAGNOSIS — R682 Dry mouth, unspecified: Secondary | ICD-10-CM

## 2021-06-21 DIAGNOSIS — K118 Other diseases of salivary glands: Secondary | ICD-10-CM

## 2021-06-21 DIAGNOSIS — J029 Acute pharyngitis, unspecified: Secondary | ICD-10-CM | POA: Diagnosis not present

## 2021-06-21 DIAGNOSIS — K146 Glossodynia: Secondary | ICD-10-CM

## 2021-06-21 LAB — POCT RAPID STREP A (OFFICE): Rapid Strep A Screen: NEGATIVE

## 2021-06-21 NOTE — Progress Notes (Signed)
Subjective:    Patient ID: Mallory Cooke, female    DOB: 10-11-1965, 56 y.o.   MRN: [id number]  HPI Pt is a 56 yo female who presents to the clinic with concerns about tongue sore and burning. She presented to the clinic back in October after the tongue burning just started. She had been treated for thrush and physical exam findings or lab findings for symptoms. Her B12 was a little low so we started with IM injection and OTC b12 supplements. She finished a medrol dose pack. None of this has made any difference. For last 2 months the tongue burning has been bearable but but for the last few weeks worsened. She now has a sore. Denies any known trauma. No new medications. No fever, chills, night sweats, weight loss. She has no smoking or tobacco history. She does admit her mouth feels dry. She is really worried about tongue cancer as she has a friend with it right now.    .. Active Ambulatory Problems    Diagnosis Date Noted   Depression 09/19/2014   Generalized anxiety disorder 09/19/2014   Migraine with aura and without status migrainosus, not intractable 01/28/2016   History of hematuria 01/29/2016   Asthma 03/11/2017   Crushing injury of right hand 11/20/2017   Right hand pain 11/20/2017   Dyslipidemia (high LDL; low HDL) 07/09/2018   Skin tag of anus 03/29/2019   Bartholin cyst 07/11/2019   Left ankle injury 09/28/2019   Contusion of left heel 09/28/2019   Tongue swelling 04/01/2021   BMS (burning mouth syndrome) 04/01/2021   TMJ tenderness, right 04/01/2021   Dry mouth 06/21/2021   Sore throat 06/21/2021   Tongue ulcer 06/21/2021   Tongue burning sensation 06/21/2021   Mass of left parotid gland 06/21/2021   Resolved Ambulatory Problems    Diagnosis Date Noted   Abdominal discomfort 09/19/2014   CN (constipation) 09/19/2014   Acute pain of right foot 01/29/2016   Past Medical History:  Diagnosis Date   Anxiety      Review of Systems See HPI.     Objective:    Physical Exam Vitals reviewed.  Constitutional:      Appearance: Normal appearance.  HENT:     Head: Normocephalic.     Right Ear: Tympanic membrane, ear canal and external ear normal.     Left Ear: Tympanic membrane, ear canal and external ear normal. There is no impacted cerumen.     Nose: Nose normal.     Mouth/Throat:     Mouth: Mucous membranes are moist.     Pharynx: Posterior oropharyngeal erythema present. No oropharyngeal exudate.     Comments: Oropharynx erythema with no exudate Left lateral tongue 2mm ulcer  Eyes:     Conjunctiva/sclera: Conjunctivae normal.  Neck:     Comments: Bilateral swollen anterior cervical adenopathy.  Firm left parotid gland mass about size of acorn, non tender Cardiovascular:     Rate and Rhythm: Normal rate and regular rhythm.     Pulses: Normal pulses.  Pulmonary:     Effort: Pulmonary effort is normal.     Breath sounds: Normal breath sounds.  Neurological:     General: No focal deficit present.     Mental Status: She is alert and oriented to person, place, and time.  Psychiatric:     Comments: Anxious.           Assessment & Plan:  Marland Kitchen.Marland Kitchen.Mallory Cooke was seen today for oral pain.  Diagnoses and all orders  for this visit:  Tongue burning sensation -     Sjogren's syndrome antibods(ssa + ssb) -     Ambulatory referral to ENT -     Culture, Group A Strep -     POCT rapid strep A  Tongue ulcer -     Sjogren's syndrome antibods(ssa + ssb) -     Ambulatory referral to ENT -     Culture, Group A Strep -     POCT rapid strep A  Sore throat -     Ambulatory referral to ENT -     Culture, Group A Strep -     POCT rapid strep A  Dry mouth -     Ambulatory referral to ENT -     Culture, Group A Strep -     POCT rapid strep A -     CT MAXILLOFACIAL W & WO CONTRAST; Future  Mass of left parotid gland -     CT MAXILLOFACIAL W & WO CONTRAST; Future   Unclear etiology.  She does have a tongue sore/ulcer that could need a biopsy. It  does not appear concerning purely by physical exam findings but pt has had symptoms for 3 months now. No tobacco use. Will send to ENT.  She does have firm mass of left parotid. CT scan ordered.  Labs ordered for Sjogrens Culture sent off of oropharynx for bacteria. Follow up as needed or with any new symptoms.

## 2021-06-21 NOTE — Patient Instructions (Signed)
Will get CT of face.  Get labs today.  Will make referral to [facility]

## 2021-06-21 NOTE — Telephone Encounter (Signed)
Appt made

## 2021-06-23 LAB — CULTURE, GROUP A STREP
MICRO NUMBER:: 12871195
SPECIMEN QUALITY:: ADEQUATE

## 2021-06-24 ENCOUNTER — Encounter: Payer: Self-pay | Admitting: Physician Assistant

## 2021-06-24 NOTE — Progress Notes (Signed)
Strep culture was negative.

## 2021-06-25 DIAGNOSIS — F331 Major depressive disorder, recurrent, moderate: Secondary | ICD-10-CM | POA: Diagnosis not present

## 2021-06-26 ENCOUNTER — Other Ambulatory Visit: Payer: Self-pay

## 2021-06-26 ENCOUNTER — Ambulatory Visit (INDEPENDENT_AMBULATORY_CARE_PROVIDER_SITE_OTHER): Payer: BC Managed Care – PPO

## 2021-06-26 DIAGNOSIS — K118 Other diseases of salivary glands: Secondary | ICD-10-CM | POA: Diagnosis not present

## 2021-06-26 DIAGNOSIS — R220 Localized swelling, mass and lump, head: Secondary | ICD-10-CM | POA: Diagnosis not present

## 2021-06-26 DIAGNOSIS — R682 Dry mouth, unspecified: Secondary | ICD-10-CM | POA: Diagnosis not present

## 2021-06-26 MED ORDER — IOHEXOL 300 MG/ML  SOLN
100.0000 mL | Freq: Once | INTRAMUSCULAR | Status: AC | PRN
  Administered 2021-06-26: 75 mL via INTRAVENOUS

## 2021-06-28 MED ORDER — AMOXICILLIN-POT CLAVULANATE 875-125 MG PO TABS: 1.0000 | ORAL_TABLET | Freq: Two times a day (BID) | ORAL | 0 refills | Status: AC

## 2021-06-28 NOTE — Progress Notes (Signed)
GREAT news. You do have bilateral parotid lymph nodes detected with left greater than right but no tissue that looks abnormal.   You do have a right maxillary sinusitis. When do you see ENT they would like handle this but I could start you on antibiotic.

## 2021-07-02 DIAGNOSIS — F331 Major depressive disorder, recurrent, moderate: Secondary | ICD-10-CM | POA: Diagnosis not present

## 2021-07-09 DIAGNOSIS — F331 Major depressive disorder, recurrent, moderate: Secondary | ICD-10-CM | POA: Diagnosis not present

## 2021-07-17 NOTE — Telephone Encounter (Signed)
All notes, labs and images faxed to Dr. Marisa Sprinkles - [facility]

## 2021-07-17 NOTE — Telephone Encounter (Signed)
Can you make sure Dr. Marisa Sprinkles has all patient notes regarding her mouth soreness and labs? And send CT as well.

## 2021-07-22 DIAGNOSIS — J329 Chronic sinusitis, unspecified: Secondary | ICD-10-CM | POA: Diagnosis not present

## 2021-07-22 DIAGNOSIS — K146 Glossodynia: Secondary | ICD-10-CM | POA: Diagnosis not present

## 2021-07-25 DIAGNOSIS — F331 Major depressive disorder, recurrent, moderate: Secondary | ICD-10-CM | POA: Diagnosis not present

## 2021-08-05 ENCOUNTER — Encounter: Payer: Self-pay | Admitting: Physician Assistant

## 2021-08-13 DIAGNOSIS — F331 Major depressive disorder, recurrent, moderate: Secondary | ICD-10-CM | POA: Diagnosis not present

## 2021-08-19 ENCOUNTER — Encounter: Payer: Self-pay | Admitting: Physician Assistant

## 2021-08-19 DIAGNOSIS — K140 Glossitis: Secondary | ICD-10-CM

## 2021-08-20 DIAGNOSIS — K146 Glossodynia: Secondary | ICD-10-CM | POA: Diagnosis not present

## 2021-08-27 DIAGNOSIS — F331 Major depressive disorder, recurrent, moderate: Secondary | ICD-10-CM | POA: Diagnosis not present

## 2021-09-10 DIAGNOSIS — F331 Major depressive disorder, recurrent, moderate: Secondary | ICD-10-CM | POA: Diagnosis not present

## 2021-09-19 DIAGNOSIS — F331 Major depressive disorder, recurrent, moderate: Secondary | ICD-10-CM | POA: Diagnosis not present

## 2021-10-01 DIAGNOSIS — K146 Glossodynia: Secondary | ICD-10-CM | POA: Diagnosis not present

## 2021-10-08 DIAGNOSIS — F331 Major depressive disorder, recurrent, moderate: Secondary | ICD-10-CM | POA: Diagnosis not present

## 2021-10-15 DIAGNOSIS — F331 Major depressive disorder, recurrent, moderate: Secondary | ICD-10-CM | POA: Diagnosis not present

## 2021-10-22 DIAGNOSIS — F331 Major depressive disorder, recurrent, moderate: Secondary | ICD-10-CM | POA: Diagnosis not present

## 2021-11-07 DIAGNOSIS — F331 Major depressive disorder, recurrent, moderate: Secondary | ICD-10-CM | POA: Diagnosis not present

## 2021-11-10 ENCOUNTER — Other Ambulatory Visit: Payer: Self-pay | Admitting: Physician Assistant

## 2021-11-10 DIAGNOSIS — F330 Major depressive disorder, recurrent, mild: Secondary | ICD-10-CM

## 2021-11-21 ENCOUNTER — Encounter: Payer: Self-pay | Admitting: Physician Assistant

## 2021-11-21 NOTE — Telephone Encounter (Signed)
Called x1, could not LVM, VM box full. Will attempt again later. AMUCK

## 2021-11-21 NOTE — Telephone Encounter (Signed)
Called x2, VM BOX full. Could not LVM. Will attempt again later. AMUCK

## 2021-12-05 DIAGNOSIS — F331 Major depressive disorder, recurrent, moderate: Secondary | ICD-10-CM | POA: Diagnosis not present

## 2021-12-12 DIAGNOSIS — F331 Major depressive disorder, recurrent, moderate: Secondary | ICD-10-CM | POA: Diagnosis not present

## 2021-12-26 DIAGNOSIS — F331 Major depressive disorder, recurrent, moderate: Secondary | ICD-10-CM | POA: Diagnosis not present

## 2022-01-09 DIAGNOSIS — F331 Major depressive disorder, recurrent, moderate: Secondary | ICD-10-CM | POA: Diagnosis not present

## 2022-01-16 DIAGNOSIS — F331 Major depressive disorder, recurrent, moderate: Secondary | ICD-10-CM | POA: Diagnosis not present

## 2022-01-23 DIAGNOSIS — F331 Major depressive disorder, recurrent, moderate: Secondary | ICD-10-CM | POA: Diagnosis not present

## 2022-01-24 ENCOUNTER — Encounter: Payer: Self-pay | Admitting: Physician Assistant

## 2022-01-28 DIAGNOSIS — F331 Major depressive disorder, recurrent, moderate: Secondary | ICD-10-CM | POA: Diagnosis not present

## 2022-02-01 DIAGNOSIS — W010XXA Fall on same level from slipping, tripping and stumbling without subsequent striking against object, initial encounter: Secondary | ICD-10-CM | POA: Diagnosis not present

## 2022-02-01 DIAGNOSIS — S8392XA Sprain of unspecified site of left knee, initial encounter: Secondary | ICD-10-CM | POA: Diagnosis not present

## 2022-02-03 ENCOUNTER — Other Ambulatory Visit: Payer: Self-pay | Admitting: Physician Assistant

## 2022-02-03 DIAGNOSIS — D123 Benign neoplasm of transverse colon: Secondary | ICD-10-CM | POA: Diagnosis not present

## 2022-02-03 DIAGNOSIS — Z1211 Encounter for screening for malignant neoplasm of colon: Secondary | ICD-10-CM | POA: Diagnosis not present

## 2022-02-03 DIAGNOSIS — K648 Other hemorrhoids: Secondary | ICD-10-CM | POA: Diagnosis not present

## 2022-02-03 DIAGNOSIS — F330 Major depressive disorder, recurrent, mild: Secondary | ICD-10-CM

## 2022-02-03 LAB — HM COLONOSCOPY

## 2022-02-13 DIAGNOSIS — F331 Major depressive disorder, recurrent, moderate: Secondary | ICD-10-CM | POA: Diagnosis not present

## 2022-02-25 DIAGNOSIS — F331 Major depressive disorder, recurrent, moderate: Secondary | ICD-10-CM | POA: Diagnosis not present

## 2022-03-02 ENCOUNTER — Encounter: Payer: Self-pay | Admitting: Physician Assistant

## 2022-03-02 DIAGNOSIS — K644 Residual hemorrhoidal skin tags: Secondary | ICD-10-CM

## 2022-03-10 ENCOUNTER — Other Ambulatory Visit: Payer: Self-pay | Admitting: Physician Assistant

## 2022-03-10 DIAGNOSIS — Z1231 Encounter for screening mammogram for malignant neoplasm of breast: Secondary | ICD-10-CM

## 2022-03-10 NOTE — Telephone Encounter (Signed)
LVM to call back to make a follow up appointment- tvt

## 2022-03-11 ENCOUNTER — Encounter: Payer: Self-pay | Admitting: Physician Assistant

## 2022-03-11 DIAGNOSIS — F331 Major depressive disorder, recurrent, moderate: Secondary | ICD-10-CM | POA: Diagnosis not present

## 2022-03-18 DIAGNOSIS — F331 Major depressive disorder, recurrent, moderate: Secondary | ICD-10-CM | POA: Diagnosis not present

## 2022-03-20 ENCOUNTER — Ambulatory Visit (INDEPENDENT_AMBULATORY_CARE_PROVIDER_SITE_OTHER): Payer: BC Managed Care – PPO

## 2022-03-20 DIAGNOSIS — Z1231 Encounter for screening mammogram for malignant neoplasm of breast: Secondary | ICD-10-CM

## 2022-03-21 ENCOUNTER — Encounter: Payer: Self-pay | Admitting: Physician Assistant

## 2022-03-21 DIAGNOSIS — G8929 Other chronic pain: Secondary | ICD-10-CM

## 2022-03-22 NOTE — Progress Notes (Signed)
Please call patient. Normal mammogram.  Repeat in 1 year.  

## 2022-03-25 DIAGNOSIS — G8929 Other chronic pain: Secondary | ICD-10-CM | POA: Insufficient documentation

## 2022-04-01 DIAGNOSIS — F331 Major depressive disorder, recurrent, moderate: Secondary | ICD-10-CM | POA: Diagnosis not present

## 2022-04-02 ENCOUNTER — Encounter: Payer: Self-pay | Admitting: Physician Assistant

## 2022-04-02 ENCOUNTER — Ambulatory Visit: Payer: BC Managed Care – PPO | Admitting: Physician Assistant

## 2022-04-02 VITALS — BP 124/72 | HR 84 | Ht 70.0 in | Wt 235.0 lb

## 2022-04-02 DIAGNOSIS — G8929 Other chronic pain: Secondary | ICD-10-CM

## 2022-04-02 DIAGNOSIS — E785 Hyperlipidemia, unspecified: Secondary | ICD-10-CM | POA: Diagnosis not present

## 2022-04-02 DIAGNOSIS — K644 Residual hemorrhoidal skin tags: Secondary | ICD-10-CM

## 2022-04-02 DIAGNOSIS — F330 Major depressive disorder, recurrent, mild: Secondary | ICD-10-CM

## 2022-04-02 DIAGNOSIS — D510 Vitamin B12 deficiency anemia due to intrinsic factor deficiency: Secondary | ICD-10-CM

## 2022-04-02 DIAGNOSIS — Z79899 Other long term (current) drug therapy: Secondary | ICD-10-CM | POA: Diagnosis not present

## 2022-04-02 DIAGNOSIS — Z131 Encounter for screening for diabetes mellitus: Secondary | ICD-10-CM

## 2022-04-02 DIAGNOSIS — M25562 Pain in left knee: Secondary | ICD-10-CM

## 2022-04-02 DIAGNOSIS — M25561 Pain in right knee: Secondary | ICD-10-CM | POA: Diagnosis not present

## 2022-04-02 DIAGNOSIS — Z1329 Encounter for screening for other suspected endocrine disorder: Secondary | ICD-10-CM

## 2022-04-02 MED ORDER — SERTRALINE HCL 50 MG PO TABS: 50.0000 mg | ORAL_TABLET | Freq: Every day | ORAL | 1 refills | Status: DC

## 2022-04-02 MED ORDER — DICLOFENAC SODIUM 1 % EX GEL: 4.0000 g | Freq: Four times a day (QID) | CUTANEOUS | 1 refills | Status: DC

## 2022-04-02 MED ORDER — BUPROPION HCL 100 MG PO TABS: 100.0000 mg | ORAL_TABLET | Freq: Two times a day (BID) | ORAL | 1 refills | Status: DC

## 2022-04-02 NOTE — Patient Instructions (Signed)
Will refer to general surgery.   

## 2022-04-02 NOTE — Progress Notes (Signed)
Established Patient Office Visit  Subjective   Patient ID: Mallory Cooke, female    DOB: Mar 01, 1966  Age: 56 y.o. MRN: [id number]  Chief Complaint  Patient presents with   Follow-up    HPI Pt is a 56 yo obese female with Migraines, GAD, dyslipidemia, chronic bilateral knee pain who presents to the clinic for follow up and medication refills.   She has had a skin tag of anus that she has gotten the run around from GI and dermatology about. She really wants removed and wants help with where to go.   She continues to have anxiety but does feel like with medication and counseling it is much better. She is happy with where she is right now.   She has ortho appt today for her ongoing bilateral knee pain that is worsening after 2 falls this year. She is not doing anything to help with this.   .. Active Ambulatory Problems    Diagnosis Date Noted   Depression 09/19/2014   Generalized anxiety disorder 09/19/2014   Migraine with aura and without status migrainosus, not intractable 01/28/2016   History of hematuria 01/29/2016   Asthma 03/11/2017   Crushing injury of right hand 11/20/2017   Right hand pain 11/20/2017   Dyslipidemia (high LDL; low HDL) 07/09/2018   Skin tag of anus 03/29/2019   Bartholin cyst 07/11/2019   Left ankle injury 09/28/2019   Contusion of left heel 09/28/2019   Tongue swelling 04/01/2021   BMS (burning mouth syndrome) 04/01/2021   TMJ tenderness, right 04/01/2021   Dry mouth 06/21/2021   Sore throat 06/21/2021   Tongue ulcer 06/21/2021   Tongue burning sensation 06/21/2021   Mass of left parotid gland 06/21/2021   Chronic pain of both knees 03/25/2022   Resolved Ambulatory Problems    Diagnosis Date Noted   Abdominal discomfort 09/19/2014   CN (constipation) 09/19/2014   Acute pain of right foot 01/29/2016   Past Medical History:  Diagnosis Date   Anxiety          ROS See HPI.    Objective:     BP 124/72   Pulse 84   Ht 5\' 10"  (1.778  m)   Wt 235 lb (106.6 kg)   LMP 02/21/2022   SpO2 97%   BMI 33.72 kg/m  BP Readings from Last 3 Encounters:  04/02/22 124/72  06/21/21 (!) 141/80  05/21/21 132/73   Wt Readings from Last 3 Encounters:  04/02/22 235 lb (106.6 kg)  06/21/21 218 lb (98.9 kg)  05/21/21 221 lb 9.6 oz (100.5 kg)    .05/23/21    04/02/2022    1:33 PM 10/24/2020    7:35 AM 07/07/2018    8:15 AM 05/31/2018   11:31 AM 03/11/2017    3:47 PM  Depression screen PHQ 2/9  Decreased Interest 1 1 0 1 0  Down, Depressed, Hopeless 1 1 1  0 1  PHQ - 2 Score 2 2 1 1 1   Altered sleeping 0 0 0 0   Tired, decreased energy 1 1 1 1    Change in appetite 0 1 0 0   Feeling bad or failure about yourself  0 1 0 2   Trouble concentrating 1 0 1 1   Moving slowly or fidgety/restless 0 0 0 1   Suicidal thoughts 0 0 0 0   PHQ-9 Score 4 5 3 6    Difficult doing work/chores Somewhat difficult Somewhat difficult Not difficult at all Not difficult at all    .4/9  04/02/2022    1:35 PM 10/24/2020    7:35 AM 07/07/2018    8:15 AM 05/31/2018   11:32 AM  GAD 7 : Generalized Anxiety Score  Nervous, Anxious, on Edge 1 2 1 1   Control/stop worrying 1 1 0 0  Worry too much - different things 1 1 1 1   Trouble relaxing 0 0 0 0  Restless 0 0 0 0  Easily annoyed or irritable 1 2 1 1   Afraid - awful might happen 0 1 0 0  Total GAD 7 Score 4 7 3 3   Anxiety Difficulty Not difficult at all Somewhat difficult Not difficult at all Not difficult at all      Physical Exam Constitutional:      Appearance: Normal appearance. She is obese.  Cardiovascular:     Rate and Rhythm: Normal rate and regular rhythm.     Pulses: Normal pulses.     Heart sounds: Normal heart sounds.  Pulmonary:     Effort: Pulmonary effort is normal.     Breath sounds: Normal breath sounds.  Neurological:     General: No focal deficit present.     Mental Status: She is alert and oriented to person, place, and time.  Psychiatric:        Mood and Affect: Mood  normal.         Assessment & Plan:  Marland KitchenMarland KitchenJamicia was seen today for follow-up.  Diagnoses and all orders for this visit:  Pernicious anemia -     CBC with Differential/Platelet -     B12 and Folate Panel  Dyslipidemia (high LDL; low HDL) -     Lipid Panel w/reflex Direct LDL  Thyroid disorder screen -     TSH  Medication management -     TSH -     Lipid Panel w/reflex Direct LDL -     COMPLETE METABOLIC PANEL WITH GFR -     CBC with Differential/Platelet  Diabetes mellitus screening -     COMPLETE METABOLIC PANEL WITH GFR  Mild episode of recurrent major depressive disorder (HCC) -     sertraline (ZOLOFT) 50 MG tablet; Take 1 tablet (50 mg total) by mouth daily. -     buPROPion (WELLBUTRIN) 100 MG tablet; Take 1 tablet (100 mg total) by mouth 2 (two) times daily.  Skin tag of anus -     Ambulatory referral to General Surgery  Chronic pain of both knees -     diclofenac Sodium (VOLTAREN) 1 % GEL; Apply 4 g topically 4 (four) times daily. To affected joint.   PHQ/GAD stable Continue with counseling Refilled zoloft and wellbutrin  Referral to general surgery for skin tag of anus removal  Follow up with ortho but consider diclofenac gel for bilateral knee pain   Return in about 6 months (around 10/02/2022), or if symptoms worsen or fail to improve.    Iran Planas, PA-C

## 2022-04-03 ENCOUNTER — Encounter: Payer: Self-pay | Admitting: Physician Assistant

## 2022-04-04 ENCOUNTER — Encounter: Payer: Self-pay | Admitting: Physician Assistant

## 2022-04-22 DIAGNOSIS — F331 Major depressive disorder, recurrent, moderate: Secondary | ICD-10-CM | POA: Diagnosis not present

## 2022-04-29 DIAGNOSIS — F331 Major depressive disorder, recurrent, moderate: Secondary | ICD-10-CM | POA: Diagnosis not present

## 2022-05-12 DIAGNOSIS — K644 Residual hemorrhoidal skin tags: Secondary | ICD-10-CM | POA: Diagnosis not present

## 2022-05-13 DIAGNOSIS — F331 Major depressive disorder, recurrent, moderate: Secondary | ICD-10-CM | POA: Diagnosis not present

## 2022-05-29 DIAGNOSIS — F331 Major depressive disorder, recurrent, moderate: Secondary | ICD-10-CM | POA: Diagnosis not present

## 2022-06-17 DIAGNOSIS — F331 Major depressive disorder, recurrent, moderate: Secondary | ICD-10-CM | POA: Diagnosis not present

## 2022-06-26 DIAGNOSIS — F331 Major depressive disorder, recurrent, moderate: Secondary | ICD-10-CM | POA: Diagnosis not present

## 2022-07-10 DIAGNOSIS — F331 Major depressive disorder, recurrent, moderate: Secondary | ICD-10-CM | POA: Diagnosis not present

## 2022-07-17 DIAGNOSIS — F331 Major depressive disorder, recurrent, moderate: Secondary | ICD-10-CM | POA: Diagnosis not present

## 2022-07-29 ENCOUNTER — Other Ambulatory Visit: Payer: Self-pay | Admitting: Physician Assistant

## 2022-07-29 DIAGNOSIS — F331 Major depressive disorder, recurrent, moderate: Secondary | ICD-10-CM | POA: Diagnosis not present

## 2022-07-29 DIAGNOSIS — R109 Unspecified abdominal pain: Secondary | ICD-10-CM

## 2022-08-06 ENCOUNTER — Other Ambulatory Visit: Payer: Self-pay | Admitting: Physician Assistant

## 2022-08-06 ENCOUNTER — Encounter: Payer: Self-pay | Admitting: Physician Assistant

## 2022-08-06 DIAGNOSIS — R109 Unspecified abdominal pain: Secondary | ICD-10-CM

## 2022-08-06 MED ORDER — CYCLOBENZAPRINE HCL 10 MG PO TABS: 10.0000 mg | ORAL_TABLET | Freq: Three times a day (TID) | ORAL | 1 refills | Status: DC | PRN

## 2022-08-12 DIAGNOSIS — F331 Major depressive disorder, recurrent, moderate: Secondary | ICD-10-CM | POA: Diagnosis not present

## 2022-08-19 ENCOUNTER — Ambulatory Visit: Payer: BC Managed Care – PPO | Admitting: Physician Assistant

## 2022-08-20 ENCOUNTER — Encounter: Payer: Self-pay | Admitting: Physician Assistant

## 2022-08-20 ENCOUNTER — Ambulatory Visit: Payer: BC Managed Care – PPO | Admitting: Physician Assistant

## 2022-08-20 VITALS — BP 125/69 | HR 72 | Ht 70.0 in | Wt 231.0 lb

## 2022-08-20 DIAGNOSIS — Z13220 Encounter for screening for lipoid disorders: Secondary | ICD-10-CM

## 2022-08-20 DIAGNOSIS — F330 Major depressive disorder, recurrent, mild: Secondary | ICD-10-CM | POA: Diagnosis not present

## 2022-08-20 DIAGNOSIS — D510 Vitamin B12 deficiency anemia due to intrinsic factor deficiency: Secondary | ICD-10-CM

## 2022-08-20 DIAGNOSIS — R140 Abdominal distension (gaseous): Secondary | ICD-10-CM | POA: Diagnosis not present

## 2022-08-20 DIAGNOSIS — N911 Secondary amenorrhea: Secondary | ICD-10-CM

## 2022-08-20 DIAGNOSIS — R198 Other specified symptoms and signs involving the digestive system and abdomen: Secondary | ICD-10-CM | POA: Diagnosis not present

## 2022-08-20 DIAGNOSIS — R7989 Other specified abnormal findings of blood chemistry: Secondary | ICD-10-CM | POA: Insufficient documentation

## 2022-08-20 DIAGNOSIS — L680 Hirsutism: Secondary | ICD-10-CM | POA: Insufficient documentation

## 2022-08-20 LAB — CBC WITH DIFFERENTIAL/PLATELET
Absolute Monocytes: 410 cells/uL (ref 200–950)
Basophils Absolute: 19 cells/uL (ref 0–200)
Basophils Relative: 0.3 %
Eosinophils Absolute: 120 cells/uL (ref 15–500)
Eosinophils Relative: 1.9 %
HCT: 41.7 % (ref 35.0–45.0)
Hemoglobin: 13.9 g/dL (ref 11.7–15.5)
Lymphs Abs: 1392 cells/uL (ref 850–3900)
MCH: 30 pg (ref 27.0–33.0)
MCHC: 33.3 g/dL (ref 32.0–36.0)
MCV: 89.9 fL (ref 80.0–100.0)
MPV: 9.5 fL (ref 7.5–12.5)
Monocytes Relative: 6.5 %
Neutro Abs: 4360 cells/uL (ref 1500–7800)
Neutrophils Relative %: 69.2 %
Platelets: 285 10*3/uL (ref 140–400)
RBC: 4.64 10*6/uL (ref 3.80–5.10)
RDW: 12.7 % (ref 11.0–15.0)
Total Lymphocyte: 22.1 %
WBC: 6.3 10*3/uL (ref 3.8–10.8)

## 2022-08-20 MED ORDER — BUPROPION HCL 100 MG PO TABS: 100.0000 mg | ORAL_TABLET | Freq: Two times a day (BID) | ORAL | 1 refills | Status: DC

## 2022-08-20 MED ORDER — SERTRALINE HCL 50 MG PO TABS: 50.0000 mg | ORAL_TABLET | Freq: Every day | ORAL | 1 refills | Status: DC

## 2022-08-20 NOTE — Patient Instructions (Signed)
Will get labs Will order ultrasound  Menopause and Hormone Replacement Therapy Menopause is a normal time of life when menstrual periods stop completely and the ovaries stop producing the female hormones estrogen and progesterone. Low levels of these hormones can affect your health and cause symptoms. Hormone replacement therapy (HRT) can relieve some of those symptoms. HRT is the use of artificial (synthetic) hormones to replace hormones that your body has stopped producing because you have reached menopause. Types of HRT  HRT may consist of the synthetic hormones estrogen and progestin, or it may consist of estrogen-only therapy. You and your health care provider will decide which form of HRT is best for you. If you choose to be on HRT and you have a uterus, estrogen and progestin are usually prescribed. Estrogen-only therapy is used for women who do not have a uterus. Possible options for taking HRT include: Pills. Patches. Gels. Sprays. Vaginal cream. Vaginal rings. Vaginal inserts. The amount of hormones that you take and how long you take them varies according to your health. It is important to: Begin HRT with the lowest possible dosage. Stop HRT as soon as your health care provider tells you to stop. Work with your health care provider so that you feel informed and comfortable with your decisions. Tell a health care provider about: Any allergies you have. Whether you have had blood clots or know of any risk factors you may have for blood clots. Whether you or family members have had cancer, especially cancer of the breasts, ovaries, or uterus. Any surgeries you have had. All medicines you are taking, including vitamins, herbs, eye drops, creams, and over-the-counter medicines. Whether you are pregnant or may be pregnant. Any medical conditions you have. What are the benefits? HRT can reduce the frequency and severity of menopausal symptoms. Benefits of HRT vary according to the  kind of symptoms that you have, how severe they are, and your overall health. HRT may help to improve the following symptoms of menopause: Hot flashes and night sweats. These are sudden feelings of heat that spread over the face and body. The skin may turn red, like a blush. Night sweats are hot flashes that happen while you are sleeping or trying to sleep. Bone loss (osteoporosis). The body loses calcium more quickly after menopause, causing the bones to become weaker. This can increase the risk for bone breaks (fractures). Vaginal dryness. The lining of the vagina can become thin and dry, which can cause pain during sex or cause infection, burning, or itching. Urinary tract infections. Urinary incontinence. This is the inability to control when you urinate. Irritability. Short-term memory problems. What are the risks? Risks of HRT vary depending on your individual health and medical history. Risks of HRT also depend on whether you receive both estrogen and progestin or you receive estrogen only. HRT may increase the risk of: Spotting. This is when a small amount of blood leaks from the vagina unexpectedly. Endometrial cancer. This cancer is in the lining of the uterus (endometrium). Breast cancer. Increased density of breast tissue. This can make it harder to find breast cancer on a breast X-ray (mammogram). Stroke. Heart disease. Blood clots. Gallbladder disease or liver disease. Risks of HRT can increase if you have any of the following conditions: Endometrial cancer. Liver disease. Heart disease. Breast cancer. History of blood clots. History of stroke. Follow these instructions at home: Pap tests Have Pap tests done as often as told by your health care provider. A Pap test is  sometimes called a Pap smear. It is a screening test that is used to check for signs of cancer of the cervix and vagina. A Pap test can also identify the presence of infection or precancerous changes. Pap tests  may be done: Every 3 years, starting at age 72. Every 5 years, starting after age 71, in combination with testing for human papillomavirus (HPV). More often or less often depending on other medical conditions you have, your age, and other risk factors. It is up to you to get the results of your Pap test. Ask your health care provider, or the department that is doing the test, when your results will be ready. General instructions Take over-the-counter and prescription medicines only as told by your health care provider. Do not use any products that contain nicotine or tobacco. These products include cigarettes, chewing tobacco, and vaping devices, such as e-cigarettes. If you need help quitting, ask your health care provider. Get mammograms, pelvic exams, and medical checkups as often as told by your health care provider. Keep all follow-up visits. This is important. Contact a health care provider if you have: Pain or swelling in your legs. Lumps or changes in your breasts or armpits. Pain, burning, or bleeding when you urinate. Unusual vaginal bleeding. Dizziness or headaches. Pain in your abdomen. Get help right away if you have: Shortness of breath. Chest pain. Slurred speech. Weakness or numbness in any part of your arms or legs. These symptoms may represent a serious problem that is an emergency. Do not wait to see if the symptoms will go away. Get medical help right away. Call your local emergency services (911 in the [place] Do not drive yourself to the hospital. Summary Menopause is a normal time of life when menstrual periods stop completely and the ovaries stop producing the female hormones estrogen and progesterone. HRT can reduce the frequency and severity of menopausal symptoms. Risks of HRT vary depending on your individual health and medical history. This information is not intended to replace advice given to you by your health care provider. Make sure you discuss any  questions you have with your health care provider. Document Revised: 11/28/2019 Document Reviewed: 11/28/2019 Elsevier Patient Education  [place].   Menopause Menopause is the normal time of a woman's life when menstrual periods stop completely. It marks the natural end to a woman's ability to become pregnant. It can be defined as the absence of a menstrual period for 12 months without another medical cause. The transition to menopause (perimenopause) most often happens between the ages of 17 and 39, and can last for many years. During perimenopause, hormone levels change in your body, which can cause symptoms and affect your health. Menopause may increase your risk for: Weakened bones (osteoporosis), which causes fractures. Depression. Hardening and narrowing of the arteries (atherosclerosis), which can cause heart attacks and strokes. What are the causes? This condition is usually caused by a natural change in hormone levels that happens as you get older. The condition may also be caused by changes that are not natural, including: Surgery to remove both ovaries (surgical menopause). Side effects from some medicines, such as chemotherapy used to treat cancer (chemical menopause). What increases the risk? This condition is more likely to start at an earlier age if you have certain medical conditions or have undergone treatments, including: A tumor of the pituitary gland in the brain. A disease that affects the ovaries and hormones. Certain cancer treatments, such as chemotherapy or hormone therapy,  or radiation therapy on the pelvis. Heavy smoking and excessive alcohol use. Family history of early menopause. This condition is also more likely to develop earlier in women who are very thin. What are the signs or symptoms? Symptoms of this condition include: Hot flashes. Irregular menstrual periods. Night sweats. Changes in feelings about sex. This could be a decrease in sex drive  or an increased discomfort around your sexuality. Vaginal dryness and thinning of the vaginal walls. This may cause painful sex. Dryness of the skin and development of wrinkles. Headaches. Problems sleeping (insomnia). Mood swings or irritability. Memory problems. Weight gain. Hair growth on the face and chest. Bladder infections or problems with urinating. How is this diagnosed? This condition is diagnosed based on your medical history, a physical exam, your age, your menstrual history, and your symptoms. Hormone tests may also be done. How is this treated? In some cases, no treatment is needed. You and your health care provider should make a decision together about whether treatment is necessary. Treatment will be based on your individual condition and preferences. Treatment for this condition focuses on managing symptoms. Treatment may include: Menopausal hormone therapy (MHT). Medicines to treat specific symptoms or complications. Acupuncture. Vitamin or herbal supplements. Before starting treatment, make sure to let your health care provider know if you have a personal or family history of these conditions: Heart disease. Breast cancer. Blood clots. Diabetes. Osteoporosis. Follow these instructions at home: Lifestyle Do not use any products that contain nicotine or tobacco, such as cigarettes, e-cigarettes, and chewing tobacco. If you need help quitting, ask your health care provider. Get at least 30 minutes of physical activity on 5 or more days each week. Avoid alcoholic and caffeinated beverages, as well as spicy foods. This may help prevent hot flashes. Get 7-8 hours of sleep each night. If you have hot flashes, try: Dressing in layers. Avoiding things that may trigger hot flashes, such as spicy food, warm places, or stress. Taking slow, deep breaths when a hot flash starts. Keeping a fan in your home and office. Find ways to manage stress, such as deep breathing,  meditation, or journaling. Consider going to group therapy with other women who are having menopause symptoms. Ask your health care provider about recommended group therapy meetings. Eating and drinking  Eat a healthy, balanced diet that contains whole grains, lean protein, low-fat dairy, and plenty of fruits and vegetables. Your health care provider may recommend adding more soy to your diet. Foods that contain soy include tofu, tempeh, and soy milk. Eat plenty of foods that contain calcium and vitamin D for bone health. Items that are rich in calcium include low-fat milk, yogurt, beans, almonds, sardines, broccoli, and kale. Medicines Take over-the-counter and prescription medicines only as told by your health care provider. Talk with your health care provider before starting any herbal supplements. If prescribed, take vitamins and supplements as told by your health care provider. General instructions  Keep track of your menstrual periods, including: When they occur. How heavy they are and how long they last. How much time passes between periods. Keep track of your symptoms, noting when they start, how often you have them, and how long they last. Use vaginal lubricants or moisturizers to help with vaginal dryness and improve comfort during sex. Keep all follow-up visits. This is important. This includes any group therapy or counseling. Contact a health care provider if: You are still having menstrual periods after age 37. You have pain during sex. You have  not had a period for 12 months and you develop vaginal bleeding. Get help right away if you have: Severe depression. Excessive vaginal bleeding. Pain when you urinate. A fast or irregular heartbeat (palpitations). Severe headaches. Abdominal pain or severe indigestion. Summary Menopause is a normal time of life when menstrual periods stop completely. It is usually defined as the absence of a menstrual period for 12 months without  another medical cause. The transition to menopause (perimenopause) most often happens between the ages of 53 and 79 and can last for several years. Symptoms can be managed through medicines, lifestyle changes, and complementary therapies such as acupuncture. Eat a balanced diet that is rich in nutrients to promote bone health and heart health and to manage symptoms during menopause. This information is not intended to replace advice given to you by your health care provider. Make sure you discuss any questions you have with your health care provider. Document Revised: 02/24/2020 Document Reviewed: 11/10/2019 Elsevier Patient Education  [place].

## 2022-08-20 NOTE — Progress Notes (Signed)
Established Patient Office Visit  Subjective   Patient ID: Mallory Cooke, female    DOB: 05-18-1966  Age: 57 y.o. MRN: [id number]  Chief Complaint  Patient presents with   Amenorrhea    HPI Pt is a 57 yo female concerned about no period for the last 4 months. She is also having weight gain, hard distended stomach. She took home pregnancy test and was negative. She wants to make sure nothing is going on.   Her bowel movements are solid and full. Denies any constipation or diarrhea.   No family hx of ovarian cancer.   Her depression and anxiety are for the most part doing well on zoloft. No concerns. No SI/HC.    Active Ambulatory Problems    Diagnosis Date Noted   Depression 09/19/2014   Generalized anxiety disorder 09/19/2014   Migraine with aura and without status migrainosus, not intractable 01/28/2016   History of hematuria 01/29/2016   Asthma 03/11/2017   Crushing injury of right hand 11/20/2017   Right hand pain 11/20/2017   Dyslipidemia (high LDL; low HDL) 07/09/2018   Skin tag of anus 03/29/2019   Bartholin cyst 07/11/2019   Left ankle injury 09/28/2019   Contusion of left heel 09/28/2019   Tongue swelling 04/01/2021   BMS (burning mouth syndrome) 04/01/2021   TMJ tenderness, right 04/01/2021   Dry mouth 06/21/2021   Sore throat 06/21/2021   Tongue ulcer 06/21/2021   Tongue burning sensation 06/21/2021   Mass of left parotid gland 06/21/2021   Chronic pain of both knees 03/25/2022   Pernicious anemia 08/20/2022   Amenorrhea, secondary 08/20/2022   Bloating 08/20/2022   Abdominal fullness 08/20/2022   Low vitamin D level 08/20/2022   Hirsutism 08/20/2022   Resolved Ambulatory Problems    Diagnosis Date Noted   Abdominal discomfort 09/19/2014   CN (constipation) 09/19/2014   Acute pain of right foot 01/29/2016   Past Medical History:  Diagnosis Date   Anxiety      ROS   See HPI.  Objective:     BP 125/69   Pulse 72   Ht 5\' 10"  (1.778 m)    Wt 231 lb (104.8 kg)   SpO2 96%   BMI 33.15 kg/m  BP Readings from Last 3 Encounters:  08/20/22 125/69  04/02/22 124/72  06/21/21 (!) 141/80   Wt Readings from Last 3 Encounters:  08/20/22 231 lb (104.8 kg)  04/02/22 235 lb (106.6 kg)  06/21/21 218 lb (98.9 kg)    ..    08/20/2022    8:43 AM 04/02/2022    1:33 PM 10/24/2020    7:35 AM 07/07/2018    8:15 AM 05/31/2018   11:31 AM  Depression screen PHQ 2/9  Decreased Interest 1 1 1  0 1  Down, Depressed, Hopeless 1 1 1 1  0  PHQ - 2 Score 2 2 2 1 1   Altered sleeping 0 0 0 0 0  Tired, decreased energy 1 1 1 1 1   Change in appetite 0 0 1 0 0  Feeling bad or failure about yourself  1 0 1 0 2  Trouble concentrating 1 1 0 1 1  Moving slowly or fidgety/restless 0 0 0 0 1  Suicidal thoughts 0 0 0 0 0  PHQ-9 Score 5 4 5 3 6   Difficult doing work/chores Somewhat difficult Somewhat difficult Somewhat difficult Not difficult at all Not difficult at all   .Marland Kitchen    08/20/2022    8:44 AM 04/02/2022  1:35 PM 10/24/2020    7:35 AM 07/07/2018    8:15 AM  GAD 7 : Generalized Anxiety Score  Nervous, Anxious, on Edge 1 1 2 1   Control/stop worrying 1 1 1  0  Worry too much - different things 1 1 1 1   Trouble relaxing 0 0 0 0  Restless 1 0 0 0  Easily annoyed or irritable 1 1 2 1   Afraid - awful might happen 0 0 1 0  Total GAD 7 Score 5 4 7 3   Anxiety Difficulty Somewhat difficult Not difficult at all Somewhat difficult Not difficult at all      Physical Exam Constitutional:      Appearance: Normal appearance. She is obese.  HENT:     Head: Normocephalic.  Neck:     Vascular: No carotid bruit.  Cardiovascular:     Rate and Rhythm: Normal rate and regular rhythm.     Pulses: Normal pulses.     Heart sounds: Normal heart sounds.  Pulmonary:     Effort: Pulmonary effort is normal.     Breath sounds: Normal breath sounds.  Abdominal:     General: There is distension.     Palpations: Abdomen is soft. There is no mass.      Tenderness: There is no abdominal tenderness. There is no right CVA tenderness, left CVA tenderness, guarding or rebound.     Hernia: No hernia is present.     Comments: Distended full abdomen  Musculoskeletal:     Cervical back: Normal range of motion and neck supple. No rigidity or tenderness.     Right lower leg: No edema.     Left lower leg: No edema.  Lymphadenopathy:     Cervical: No cervical adenopathy.  Neurological:     General: No focal deficit present.     Mental Status: She is alert and oriented to person, place, and time.  Psychiatric:        Mood and Affect: Mood normal.       The 10-year ASCVD risk score (Arnett DK, et al., 2019) is: 3%    Assessment & Plan:  Marland KitchenMarland KitchenLaporcha was seen today for amenorrhea.  Diagnoses and all orders for this visit:  Amenorrhea, secondary -     COMPLETE METABOLIC PANEL WITH GFR -     Cancel: B12 and Folate Panel -     Cancel: VITAMIN D 25 Hydroxy (Vit-D Deficiency, Fractures) -     B-HCG Quant -     TSH -     FSH/LH -     Progesterone -     Cancel: CP Testosterone, BIO-Female/Children -     Estradiol -     CBC w/Diff/Platelet -     CP Testosterone, BIO-Female/Children -     VITAMIN D 25 Hydroxy (Vit-D Deficiency, Fractures) -     B12 and Folate Panel  Mild episode of recurrent major depressive disorder (HCC) -     sertraline (ZOLOFT) 50 MG tablet; Take 1 tablet (50 mg total) by mouth daily. -     buPROPion (WELLBUTRIN) 100 MG tablet; Take 1 tablet (100 mg total) by mouth 2 (two) times daily.  Screening for lipid disorders -     Lipid Panel w/reflex Direct LDL  Bloating -     COMPLETE METABOLIC PANEL WITH GFR -     Cancel: B12 and Folate Panel -     Cancel: VITAMIN D 25 Hydroxy (Vit-D Deficiency, Fractures) -     B-HCG Quant -  TSH -     FSH/LH -     Progesterone -     Cancel: CP Testosterone, BIO-Female/Children -     Estradiol -     CBC w/Diff/Platelet -     US Abdomen Complete; Future -     US Pelvic Complete With  Transvaginal; Future -     CP Testosterone, BIO-Female/Children -     VITAMIN D 25 Hydroxy (Vit-D Deficiency, Fractures) -     B12 and Folate Panel  Abdominal fullness -     COMPLETE METABOLIC PANEL WITH GFR -     Cancel: B12 and Folate Panel -     Cancel: VITAMIN D 25 Hydroxy (Vit-D Deficiency, Fractures) -     B-HCG Quant -     TSH -     FSH/LH -     Progesterone -     Cancel: CP Testosterone, BIO-Female/Children -     Estradiol -     CBC w/Diff/Platelet -     US Abdomen Complete; Future -     US Pelvic Complete With Transvaginal; Future -     CP Testosterone, BIO-Female/Children -     VITAMIN D 25 Hydroxy (Vit-D Deficiency, Fractures) -     B12 and Folate Panel  Pernicious anemia -     B12 and Folate Panel  Low vitamin D level -     VITAMIN D 25 Hydroxy (Vit-D Deficiency, Fractures)  Hirsutism -     CP Testosterone, BIO-Female/Children   Vitals look great PHQ/GAD stable Medications refilled  Discussed menopause as the likely cause of missed periods Will get labs Discussed herbal ways to control symptoms as well as hormonal and non-hormonal therapy Will follow up after labs  Concerned the most about abdominal fullness Will get [place] of abdomen and pelvis I would like to rule out ovarian cancer early on  Spent 55 minutes reviewing chart and previous labs as well as going over current symptoms work up and treatment plan.    Iran Planas, PA-C

## 2022-08-22 ENCOUNTER — Encounter: Payer: Self-pay | Admitting: Physician Assistant

## 2022-08-25 DIAGNOSIS — F331 Major depressive disorder, recurrent, moderate: Secondary | ICD-10-CM | POA: Diagnosis not present

## 2022-08-26 ENCOUNTER — Other Ambulatory Visit: Payer: BC Managed Care – PPO

## 2022-09-02 ENCOUNTER — Other Ambulatory Visit: Payer: BC Managed Care – PPO

## 2022-09-08 DIAGNOSIS — F331 Major depressive disorder, recurrent, moderate: Secondary | ICD-10-CM | POA: Diagnosis not present

## 2022-09-09 ENCOUNTER — Ambulatory Visit (INDEPENDENT_AMBULATORY_CARE_PROVIDER_SITE_OTHER): Payer: BC Managed Care – PPO

## 2022-09-09 DIAGNOSIS — R198 Other specified symptoms and signs involving the digestive system and abdomen: Secondary | ICD-10-CM

## 2022-09-09 DIAGNOSIS — D251 Intramural leiomyoma of uterus: Secondary | ICD-10-CM | POA: Diagnosis not present

## 2022-09-09 DIAGNOSIS — R140 Abdominal distension (gaseous): Secondary | ICD-10-CM | POA: Diagnosis not present

## 2022-09-09 DIAGNOSIS — N888 Other specified noninflammatory disorders of cervix uteri: Secondary | ICD-10-CM | POA: Diagnosis not present

## 2022-09-09 DIAGNOSIS — D252 Subserosal leiomyoma of uterus: Secondary | ICD-10-CM | POA: Diagnosis not present

## 2022-09-12 ENCOUNTER — Encounter: Payer: Self-pay | Admitting: Physician Assistant

## 2022-09-12 DIAGNOSIS — K760 Fatty (change of) liver, not elsewhere classified: Secondary | ICD-10-CM | POA: Insufficient documentation

## 2022-09-12 DIAGNOSIS — R9389 Abnormal findings on diagnostic imaging of other specified body structures: Secondary | ICD-10-CM | POA: Insufficient documentation

## 2022-09-12 NOTE — Progress Notes (Signed)
Pelvis ultrasound did show 2 small fibroids and thick endometrial lining. You are peri-menopausal. I do suggest biopsy just to make sure all cells are good. You need to make appt with GYN. Where do you go?we can send these reports.

## 2022-09-12 NOTE — Progress Notes (Signed)
No acute findings. Fatty liver. Weight loss and avoiding alcohol can help. Do you drink alcohol and how much? Will continue to watch liver enzymes as well.

## 2022-09-15 ENCOUNTER — Encounter: Payer: Self-pay | Admitting: Physician Assistant

## 2022-09-15 DIAGNOSIS — R9389 Abnormal findings on diagnostic imaging of other specified body structures: Secondary | ICD-10-CM

## 2022-09-22 DIAGNOSIS — F331 Major depressive disorder, recurrent, moderate: Secondary | ICD-10-CM | POA: Diagnosis not present

## 2022-10-02 ENCOUNTER — Encounter: Payer: Self-pay | Admitting: Obstetrics and Gynecology

## 2022-10-02 ENCOUNTER — Ambulatory Visit: Payer: BC Managed Care – PPO | Admitting: Obstetrics and Gynecology

## 2022-10-02 VITALS — BP 116/66 | HR 83 | Resp 16 | Ht 70.0 in | Wt 226.0 lb

## 2022-10-02 DIAGNOSIS — N911 Secondary amenorrhea: Secondary | ICD-10-CM | POA: Diagnosis not present

## 2022-10-02 DIAGNOSIS — N951 Menopausal and female climacteric states: Secondary | ICD-10-CM

## 2022-10-02 DIAGNOSIS — Z01812 Encounter for preprocedural laboratory examination: Secondary | ICD-10-CM | POA: Diagnosis not present

## 2022-10-02 LAB — POCT URINE PREGNANCY: Preg Test, Ur: NEGATIVE

## 2022-10-02 NOTE — Progress Notes (Signed)
NEW GYNECOLOGY VISIT  Subjective:  Mallory Cooke is a 57 y.o. G1P0010 with approximate LMP 04/2022 presenting for discussion of endometrial thickening on ultrasound  Mallory Cooke initially presented to her PCP on 08/20/22 for abdominal distension and weight gain/difficulty losing weight. She had be amenorrheic for appox 3-4 months at that point. Labs were ordered but have not been collected yet. Abdominal ultrasound was largely unremarkable but with possible fatty liver. Pelvic ultrasound with 7.0 x 3.3 x 4.8cm uterus with small 0.6-2.1cm fibroids, EL 11mm, & normal ovaries.   Today, she confirms the above history. Reports hot flashes and continued amenorrhea. No spotting/vaginal bleeding since November. She reports trouble losing weight and that she can't push in stomach in as much as she used to. Reports normal bladder/bowel function.   I personally reviewed the following: - Note from [facility] PA-C 08/20/22 - Pap 07/07/2018 NILM/HPV negative - Abdominal ultrasound 09/09/22 - Pelvic ultrasound 09/09/22  Past Medical History:  Diagnosis Date   Anxiety    Asthma    Depression    Past Surgical History:  Procedure Laterality Date   ECTOPIC PREGNANCY SURGERY  1990   right tube taken out.    Current Outpatient Medications on File Prior to Visit  Medication Sig Dispense Refill   albuterol (VENTOLIN HFA) 108 (90 Base) MCG/ACT inhaler Inhale 2 puffs into the lungs every 6 (six) hours as needed for wheezing or shortness of breath. 18 g 1   Ascorbic Acid (VITAMIN C PO) Take by mouth daily.     buPROPion (WELLBUTRIN) 100 MG tablet Take 1 tablet (100 mg total) by mouth 2 (two) times daily. 180 tablet 1   cyclobenzaprine (FLEXERIL) 10 MG tablet Take 1 tablet (10 mg total) by mouth 3 (three) times daily as needed for muscle spasms. 90 tablet 1   Multiple Vitamins-Minerals (ZINC PO) Take by mouth daily.     omeprazole (PRILOSEC OTC) 20 MG tablet Take 20 mg by mouth as needed.     sertraline  (ZOLOFT) 50 MG tablet Take 1 tablet (50 mg total) by mouth daily. 90 tablet 1   VITAMIN D PO Take by mouth daily.     No current facility-administered medications on file prior to visit.   Allergies  Allergen Reactions   Moxifloxacin Nausea And Vomiting    Violent Reaction Violent Reaction   Other     Real Sensitivity to Certain Medications May Cause Upset Stomach & HeadAches   OB History     Gravida  1   Para  0   Term      Preterm      AB  1   Living  0      SAB      IAB      Ectopic  1   Multiple      Live Births             Social History   Socioeconomic History   Marital status: Married    Spouse name: Not on file   Number of children: Not on file   Years of education: Not on file   Highest education level: Not on file  Occupational History   Not on file  Tobacco Use   Smoking status: Never   Smokeless tobacco: Never  Vaping Use   Vaping Use: Never used  Substance and Sexual Activity   Alcohol use: No    Alcohol/week: 0.0 standard drinks of alcohol   Drug use: No   Sexual activity:  Not Currently    Birth control/protection: None  Other Topics Concern   Not on file  Social History Narrative   Not on file   Social Determinants of Health   Financial Resource Strain: Not on file  Food Insecurity: Not on file  Transportation Needs: Not on file  Physical Activity: Not on file  Stress: Not on file  Social Connections: Not on file  Intimate Partner Violence: Not on file    Objective:   Vitals:   10/02/22 1515  BP: 116/66  Pulse: 83  Resp: 16  Weight: 226 lb (102.5 kg)  Height: 5\' 10"  (1.778 m)   General:  Alert, oriented and cooperative. Patient is in no acute distress.  Skin: Skin is warm and dry. No rash noted.   Cardiovascular: Normal heart rate noted  Respiratory: Normal respiratory effort, no problems with respiration noted  Abdomen: Soft, non-tender, non-distended   Pelvic: Deferred    Assessment and Plan:  Mallory Cooke is a 57 y.o. in likely menopausal transition, referred for endometrial thickening on ultrasound  Perimenopause Secondary amenorrhea - Reviewed ultrasound findings with the patient. In someone who is technically pre-menopausal without abnormal bleeding there isn't a specific threshold for sampling. For example, an 11mm EL is within expected limits in the secretory phase of a menstrual cycle.  - We reviewed that most (but not all) women with an endometrial cancer or hyperplasia will present with vaginal bleeding.  - Offered expectant management vs biopsy today.  -- With expectant management, we would monitor the pattern of her bleeding closely. Would strongly recommend a biopsy if she starts having spotting or heavy bleeding within the next year OR if she goes 1+ year without cycles and then starts bleeding again.  -- Reviewed risks/benefits of biopsy today including "ruling out" hyperplasia/malignancy with risks of pain, infection, bleeding, insufficient sample, or uterine perforation. - After discussion, will proceed with expectant management & close monitoring of symptoms -     POCT urine pregnancy  Return in about 1 year (around 10/02/2023) for annual exam.  No future appointments.  Lennart Pall, MD

## 2022-10-06 DIAGNOSIS — F331 Major depressive disorder, recurrent, moderate: Secondary | ICD-10-CM | POA: Diagnosis not present

## 2022-10-20 DIAGNOSIS — F331 Major depressive disorder, recurrent, moderate: Secondary | ICD-10-CM | POA: Diagnosis not present

## 2022-11-10 DIAGNOSIS — F331 Major depressive disorder, recurrent, moderate: Secondary | ICD-10-CM | POA: Diagnosis not present

## 2022-11-17 ENCOUNTER — Encounter: Payer: Self-pay | Admitting: Physician Assistant

## 2022-11-17 ENCOUNTER — Ambulatory Visit: Payer: BC Managed Care – PPO | Admitting: Physician Assistant

## 2022-11-17 VITALS — BP 123/61 | HR 76 | Ht 70.0 in | Wt 223.5 lb

## 2022-11-17 DIAGNOSIS — K760 Fatty (change of) liver, not elsewhere classified: Secondary | ICD-10-CM | POA: Diagnosis not present

## 2022-11-17 DIAGNOSIS — E785 Hyperlipidemia, unspecified: Secondary | ICD-10-CM

## 2022-11-17 DIAGNOSIS — N898 Other specified noninflammatory disorders of vagina: Secondary | ICD-10-CM | POA: Diagnosis not present

## 2022-11-17 DIAGNOSIS — F331 Major depressive disorder, recurrent, moderate: Secondary | ICD-10-CM | POA: Diagnosis not present

## 2022-11-17 LAB — WET PREP FOR TRICH, YEAST, CLUE: Specimen Quality: ADEQUATE

## 2022-11-17 MED ORDER — METRONIDAZOLE 500 MG PO TABS
500.0000 mg | ORAL_TABLET | Freq: Two times a day (BID) | ORAL | 0 refills | Status: AC
Start: 2022-11-17 — End: 2022-11-24

## 2022-11-17 NOTE — Patient Instructions (Signed)

## 2022-11-17 NOTE — Progress Notes (Signed)
Acute Office Visit  Subjective:     Patient ID: Mallory Cooke, female    DOB: 09-18-1965, 57 y.o.   MRN: [id number]  Chief Complaint  Patient presents with   Vaginal Discharge    Patient c/o  clear to white vaginal discharge x 1 to 2 days. Patient denies itching, odor, pain or urinary problems.     HPI Patient is in today for clear to white vaginal discharge for 2 days. Denies any pain, itching, burning, odor. Not done anything to make better. Not tried any new detergents, lotions or creams. No fever, chills, abdominal or flank pain.   She would like to have labs checked due to fatty liver.   .. Active Ambulatory Problems    Diagnosis Date Noted   Depression 09/19/2014   Generalized anxiety disorder 09/19/2014   Migraine with aura and without status migrainosus, not intractable 01/28/2016   History of hematuria 01/29/2016   Asthma 03/11/2017   Crushing injury of right hand 11/20/2017   Right hand pain 11/20/2017   Dyslipidemia (high LDL; low HDL) 07/09/2018   Skin tag of anus 03/29/2019   Bartholin cyst 07/11/2019   Left ankle injury 09/28/2019   Contusion of left heel 09/28/2019   Tongue swelling 04/01/2021   BMS (burning mouth syndrome) 04/01/2021   TMJ tenderness, right 04/01/2021   Dry mouth 06/21/2021   Sore throat 06/21/2021   Tongue ulcer 06/21/2021   Tongue burning sensation 06/21/2021   Mass of left parotid gland 06/21/2021   Chronic pain of both knees 03/25/2022   Pernicious anemia 08/20/2022   Amenorrhea, secondary 08/20/2022   Bloating 08/20/2022   Abdominal fullness 08/20/2022   Low vitamin D level 08/20/2022   Hirsutism 08/20/2022   Fatty liver 09/12/2022   Endometrial thickening on ultrasound 09/12/2022   Resolved Ambulatory Problems    Diagnosis Date Noted   Abdominal discomfort 09/19/2014   CN (constipation) 09/19/2014   Acute pain of right foot 01/29/2016   Past Medical History:  Diagnosis Date   Anxiety      ROS  See HPI>      Objective:    BP 123/61   Pulse 76   Ht 5\' 10"  (1.778 m)   Wt 223 lb 8 oz (101.4 kg)   SpO2 97%   BMI 32.07 kg/m  BP Readings from Last 3 Encounters:  11/17/22 123/61  10/02/22 116/66  08/20/22 125/69   Wt Readings from Last 3 Encounters:  11/17/22 223 lb 8 oz (101.4 kg)  10/02/22 226 lb (102.5 kg)  08/20/22 231 lb (104.8 kg)      Physical Exam Constitutional:      Appearance: Normal appearance.  Cardiovascular:     Rate and Rhythm: Normal rate.  Pulmonary:     Effort: Pulmonary effort is normal.  Neurological:     General: No focal deficit present.     Mental Status: She is alert and oriented to person, place, and time.  Psychiatric:        Mood and Affect: Mood normal.          Assessment & Plan:  Marland KitchenMarland KitchenBrandye was seen today for vaginal discharge.  Diagnoses and all orders for this visit:  Vaginal discharge -     WET PREP FOR TRICH, YEAST, CLUE -     metroNIDAZOLE (FLAGYL) 500 MG tablet; Take 1 tablet (500 mg total) by mouth 2 (two) times daily for 7 days.  Dyslipidemia (high LDL; low HDL) -     Lipid Panel w/reflex  Direct LDL  Fatty liver disease, nonalcoholic -     COMPLETE METABOLIC PANEL WITH GFR    Wet prep ordered Metronidazole for yeast/BV Discussed boric acid for prevention Fasting labs ordered    Tandy Gaw, PA-C

## 2022-11-17 NOTE — Progress Notes (Signed)
Culture determined bacterial vaginosis as suspected. Start metronidazole for 7 days.

## 2022-11-18 ENCOUNTER — Ambulatory Visit: Payer: Self-pay | Admitting: General Surgery

## 2022-11-18 DIAGNOSIS — E785 Hyperlipidemia, unspecified: Secondary | ICD-10-CM | POA: Diagnosis not present

## 2022-11-18 DIAGNOSIS — K760 Fatty (change of) liver, not elsewhere classified: Secondary | ICD-10-CM | POA: Diagnosis not present

## 2022-11-18 LAB — WET PREP FOR TRICH, YEAST, CLUE: MICRO NUMBER:: 15061715

## 2022-11-19 LAB — COMPLETE METABOLIC PANEL WITH GFR
AG Ratio: 2.1 (calc) (ref 1.0–2.5)
ALT: 20 U/L (ref 6–29)
AST: 13 U/L (ref 10–35)
Albumin: 4.4 g/dL (ref 3.6–5.1)
Alkaline phosphatase (APISO): 91 U/L (ref 37–153)
BUN: 10 mg/dL (ref 7–25)
CO2: 26 mmol/L (ref 20–32)
Calcium: 9.5 mg/dL (ref 8.6–10.4)
Chloride: 107 mmol/L (ref 98–110)
Creat: 0.63 mg/dL (ref 0.50–1.03)
Globulin: 2.1 g/dL (calc) (ref 1.9–3.7)
Glucose, Bld: 105 mg/dL — ABNORMAL HIGH (ref 65–99)
Potassium: 4.7 mmol/L (ref 3.5–5.3)
Sodium: 141 mmol/L (ref 135–146)
Total Bilirubin: 0.5 mg/dL (ref 0.2–1.2)
Total Protein: 6.5 g/dL (ref 6.1–8.1)
eGFR: 105 mL/min/{1.73_m2} (ref >=60)

## 2022-11-19 LAB — LIPID PANEL W/REFLEX DIRECT LDL
Cholesterol: 221 mg/dL — ABNORMAL HIGH (ref <200)
HDL: 46 mg/dL — ABNORMAL LOW (ref >=50)
LDL Cholesterol (Calc): 155 mg/dL (calc) — ABNORMAL HIGH
Non-HDL Cholesterol (Calc): 175 mg/dL (calc) — ABNORMAL HIGH (ref <130)
Total CHOL/HDL Ratio: 4.8 (calc) (ref <5.0)
Triglycerides: 94 mg/dL (ref <150)

## 2022-11-19 NOTE — Progress Notes (Signed)
Mallory Cooke,   Liver enzymes look great.  Cholesterol has improved but still not quite to goal but overall risk is under 7.5 percent. Ok to continue working on diet and exercise and recheck in 1 year.   Marland Kitchen.The 10-year ASCVD risk score (Arnett DK, et al., 2019) is: 2.4%   Values used to calculate the score:     Age: 57 years     Sex: Female     Is Non-Hispanic African American: No     Diabetic: No     Tobacco smoker: No     Systolic Blood Pressure: 123 mmHg     Is BP treated: No     HDL Cholesterol: 46 mg/dL     Total Cholesterol: 221 mg/dL  Fasting glucose up a little. Recheck A1C in 3-6 months.

## 2022-11-24 DIAGNOSIS — F331 Major depressive disorder, recurrent, moderate: Secondary | ICD-10-CM | POA: Diagnosis not present

## 2022-12-08 DIAGNOSIS — F331 Major depressive disorder, recurrent, moderate: Secondary | ICD-10-CM | POA: Diagnosis not present

## 2022-12-15 DIAGNOSIS — F331 Major depressive disorder, recurrent, moderate: Secondary | ICD-10-CM | POA: Diagnosis not present

## 2022-12-18 ENCOUNTER — Other Ambulatory Visit: Payer: BC Managed Care – PPO | Admitting: Obstetrics and Gynecology

## 2022-12-29 DIAGNOSIS — F331 Major depressive disorder, recurrent, moderate: Secondary | ICD-10-CM | POA: Diagnosis not present

## 2023-01-12 DIAGNOSIS — F331 Major depressive disorder, recurrent, moderate: Secondary | ICD-10-CM | POA: Diagnosis not present

## 2023-01-19 DIAGNOSIS — F331 Major depressive disorder, recurrent, moderate: Secondary | ICD-10-CM | POA: Diagnosis not present

## 2023-02-02 DIAGNOSIS — F331 Major depressive disorder, recurrent, moderate: Secondary | ICD-10-CM | POA: Diagnosis not present

## 2023-02-03 ENCOUNTER — Other Ambulatory Visit: Payer: Self-pay | Admitting: Physician Assistant

## 2023-02-03 DIAGNOSIS — F330 Major depressive disorder, recurrent, mild: Secondary | ICD-10-CM

## 2023-02-16 DIAGNOSIS — F331 Major depressive disorder, recurrent, moderate: Secondary | ICD-10-CM | POA: Diagnosis not present

## 2023-03-02 DIAGNOSIS — F331 Major depressive disorder, recurrent, moderate: Secondary | ICD-10-CM | POA: Diagnosis not present

## 2023-03-16 DIAGNOSIS — F331 Major depressive disorder, recurrent, moderate: Secondary | ICD-10-CM | POA: Diagnosis not present

## 2023-03-30 DIAGNOSIS — F331 Major depressive disorder, recurrent, moderate: Secondary | ICD-10-CM | POA: Diagnosis not present

## 2023-04-15 DIAGNOSIS — F331 Major depressive disorder, recurrent, moderate: Secondary | ICD-10-CM | POA: Diagnosis not present

## 2023-04-27 DIAGNOSIS — F331 Major depressive disorder, recurrent, moderate: Secondary | ICD-10-CM | POA: Diagnosis not present

## 2023-05-06 IMAGING — MG MM DIGITAL SCREENING BILAT W/ TOMO AND CAD
8 series · 8 of 24 positions shown · non-contrast
Comparison: Previous exam(s).

CLINICAL DATA: Screening.

EXAM:
DIGITAL SCREENING BILATERAL MAMMOGRAM WITH TOMOSYNTHESIS AND CAD
TECHNIQUE: Bilateral screening digital craniocaudal and mediolateral oblique
mammograms were obtained. Bilateral screening digital breast
tomosynthesis was performed. The images were evaluated with
computer-aided detection.

[R CC synth-2D]
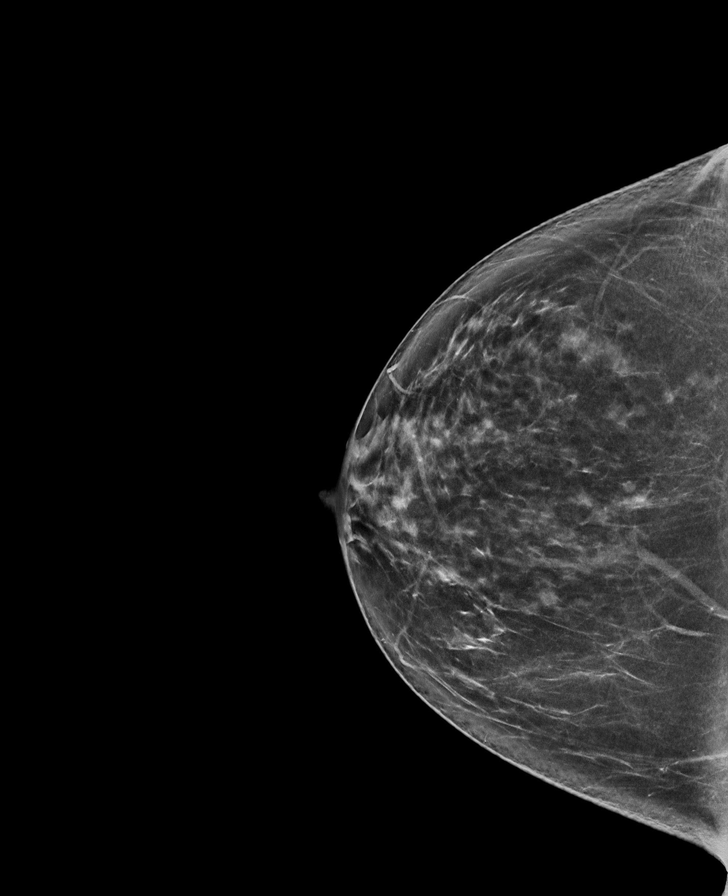

[L MLO synth-2D]
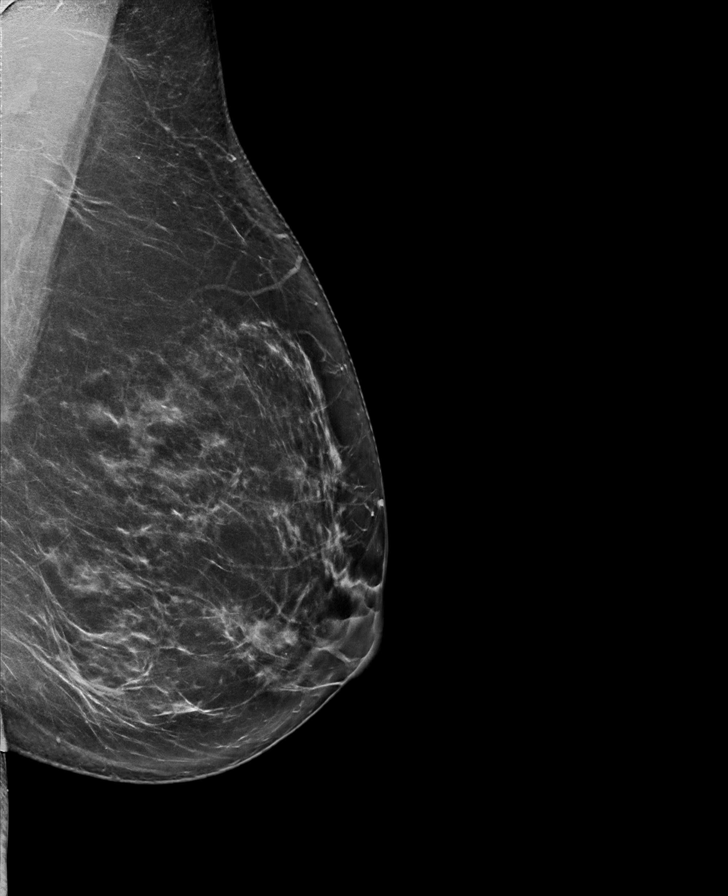

[R MLO synth-2D]
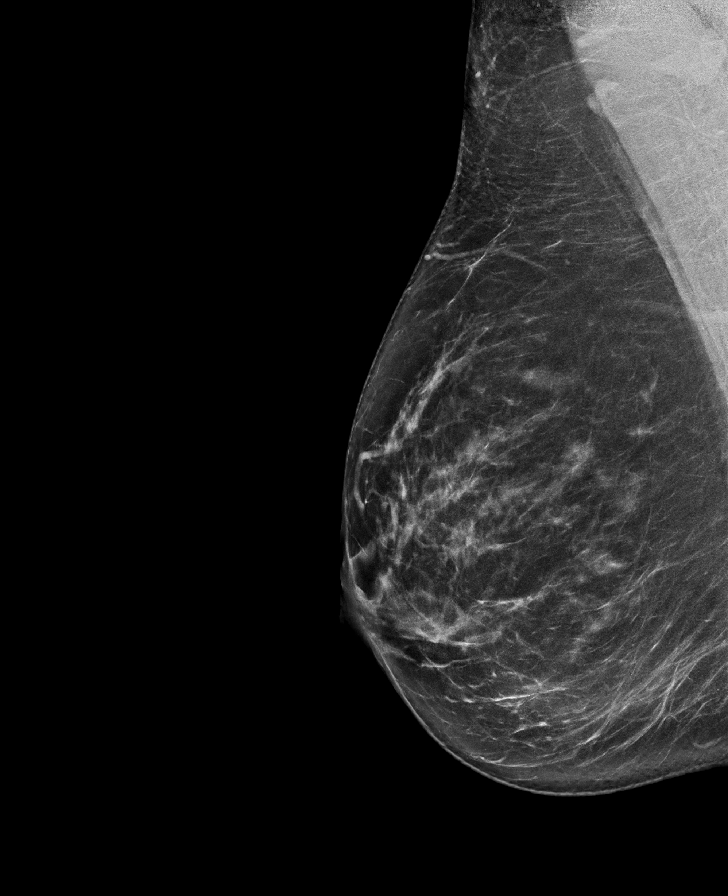

[L CC synth-2D]
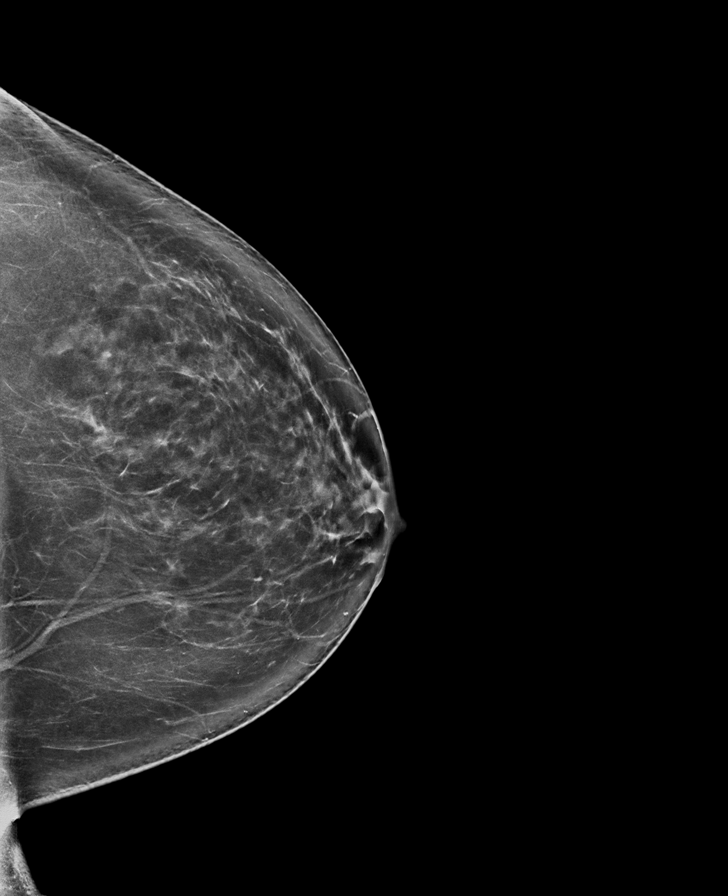

[R CC tomo · tomo slice 41/82.0]
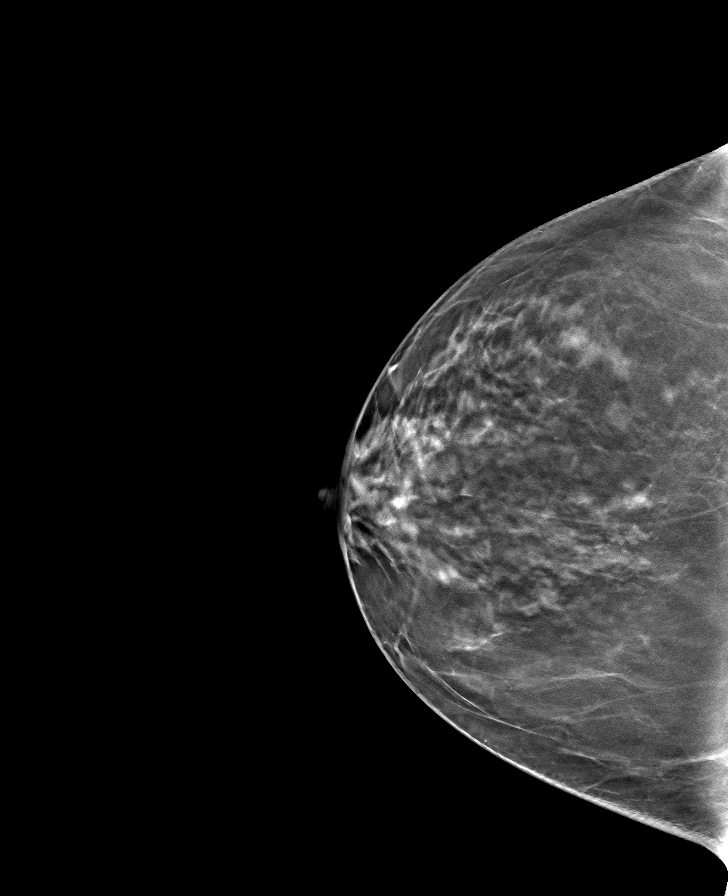

[R MLO tomo · tomo slice 43/86.0]
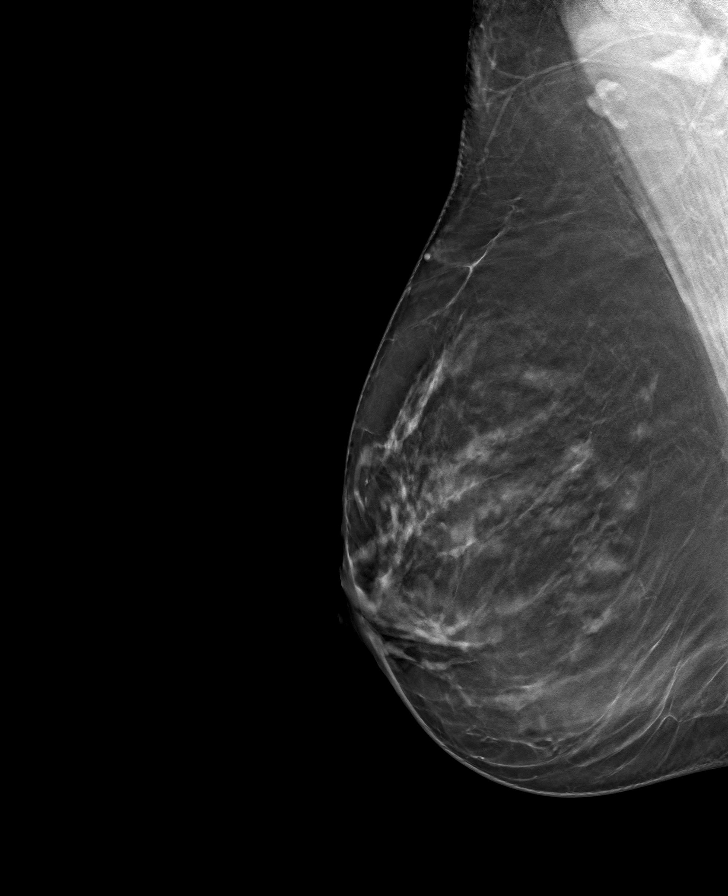

[L CC tomo · tomo slice 43/86.0]
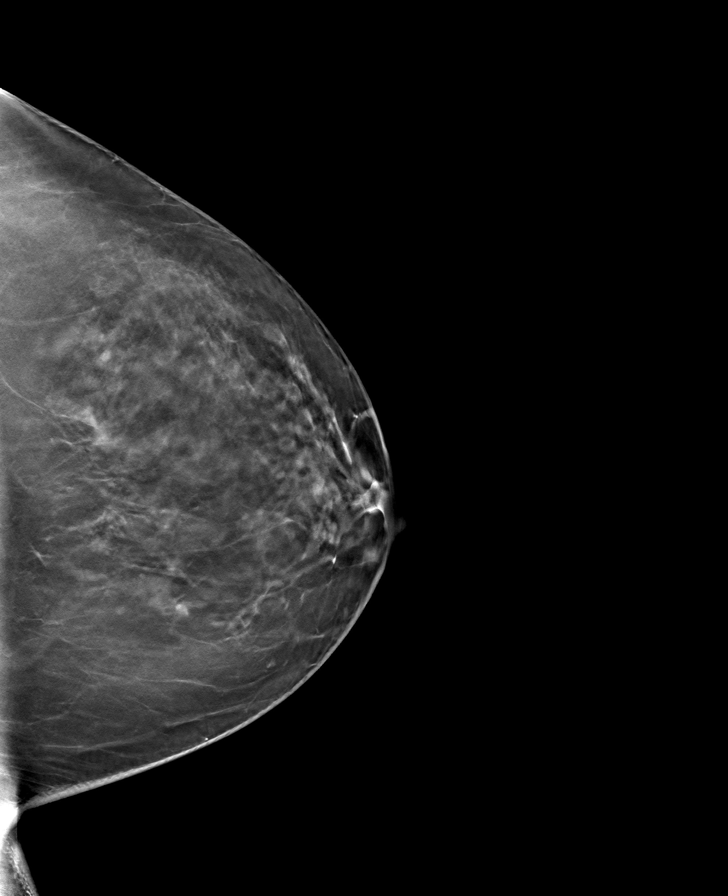

[L MLO tomo · tomo slice 43/85.0]
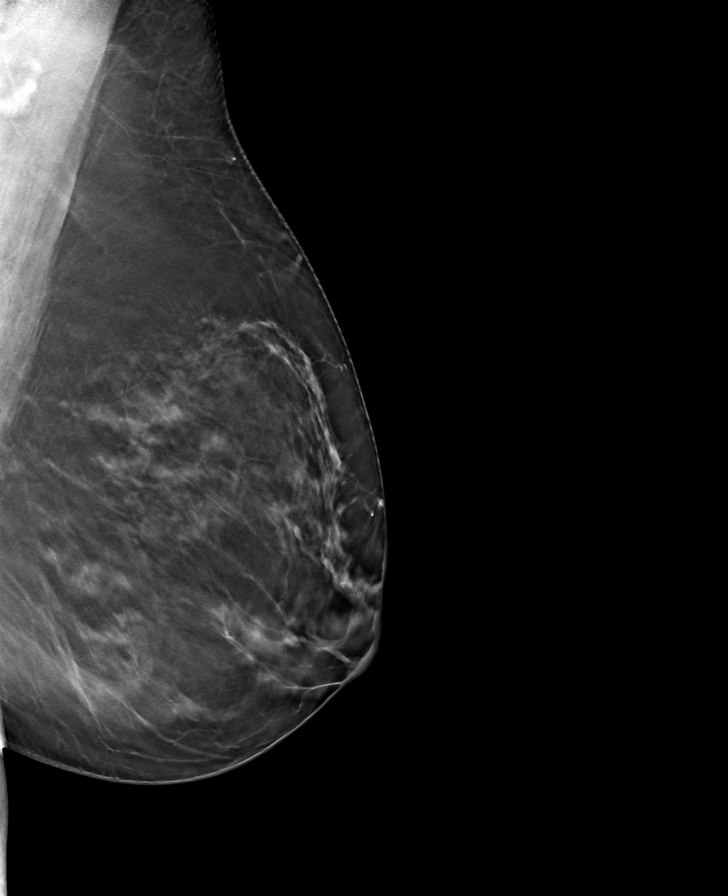

[8 of 24 positions shown; findings below may reference images not displayed]

ACR Breast Density Category b: There are scattered areas of
fibroglandular density.
FINDINGS: There are no findings suspicious for malignancy.
IMPRESSION: No mammographic evidence of malignancy. A result letter of this
screening mammogram will be mailed directly to the patient.

RECOMMENDATION:
Screening mammogram in one year. (Code:51-O-LD2)

BI-RADS CATEGORY  1: Negative.

## 2023-05-11 DIAGNOSIS — F331 Major depressive disorder, recurrent, moderate: Secondary | ICD-10-CM | POA: Diagnosis not present

## 2023-05-13 DIAGNOSIS — F331 Major depressive disorder, recurrent, moderate: Secondary | ICD-10-CM | POA: Diagnosis not present

## 2023-05-20 DIAGNOSIS — F331 Major depressive disorder, recurrent, moderate: Secondary | ICD-10-CM | POA: Diagnosis not present

## 2023-05-27 DIAGNOSIS — F331 Major depressive disorder, recurrent, moderate: Secondary | ICD-10-CM | POA: Diagnosis not present

## 2023-06-15 DIAGNOSIS — F331 Major depressive disorder, recurrent, moderate: Secondary | ICD-10-CM | POA: Diagnosis not present

## 2023-06-24 DIAGNOSIS — F331 Major depressive disorder, recurrent, moderate: Secondary | ICD-10-CM | POA: Diagnosis not present

## 2023-07-08 DIAGNOSIS — F331 Major depressive disorder, recurrent, moderate: Secondary | ICD-10-CM | POA: Diagnosis not present

## 2023-07-20 DIAGNOSIS — F331 Major depressive disorder, recurrent, moderate: Secondary | ICD-10-CM | POA: Diagnosis not present

## 2023-08-02 ENCOUNTER — Encounter: Payer: Self-pay | Admitting: Obstetrics and Gynecology

## 2023-08-03 DIAGNOSIS — F331 Major depressive disorder, recurrent, moderate: Secondary | ICD-10-CM | POA: Diagnosis not present

## 2023-08-05 ENCOUNTER — Ambulatory Visit: Payer: BC Managed Care – PPO | Admitting: Obstetrics and Gynecology

## 2023-08-07 ENCOUNTER — Ambulatory Visit: Payer: BC Managed Care – PPO | Admitting: Physician Assistant

## 2023-08-12 ENCOUNTER — Ambulatory Visit: Payer: BC Managed Care – PPO | Admitting: Physician Assistant

## 2023-08-12 ENCOUNTER — Encounter: Payer: Self-pay | Admitting: Physician Assistant

## 2023-08-12 VITALS — BP 135/59 | HR 59 | Ht 70.0 in | Wt 223.0 lb

## 2023-08-12 DIAGNOSIS — N898 Other specified noninflammatory disorders of vagina: Secondary | ICD-10-CM

## 2023-08-12 NOTE — Telephone Encounter (Unsigned)
 Copied from CRM [id number]. Topic: Clinical - Medication Question >> Aug 12, 2023 12:29 PM Gildardo Pounds wrote: Reason for CRM: Patient is inquiring if a prescription will be called in for her appointment with Jade this morning. Callback number is [phone]

## 2023-08-12 NOTE — Patient Instructions (Signed)

## 2023-08-12 NOTE — Progress Notes (Signed)
 Acute Office Visit  Subjective:     Patient ID: Mallory Cooke, female    DOB: 08/11/1965, 58 y.o.   MRN: [id number]  CC: yeast infection  HPI Patient is a 58 yo female who presents with a chief complaint of vaginal irritation and discharge. She states that there is no rash today. She bought new jeans on Saturday (5 days) that were a little tight. She denies dysuria, fever, chills, flank pain, odor, or urinary urgency. She would like testing for yeast infection today. She denies trying any new lotions, detergents, or soaps. She does have a history of bacterial vaginosis - last symptoms were on 11/17/22.   Active Ambulatory Problems    Diagnosis Date Noted   Depression 09/19/2014   Generalized anxiety disorder 09/19/2014   Migraine with aura and without status migrainosus, not intractable 01/28/2016   History of hematuria 01/29/2016   Asthma 03/11/2017   Crushing injury of right hand 11/20/2017   Right hand pain 11/20/2017   Dyslipidemia (high LDL; low HDL) 07/09/2018   Skin tag of anus 03/29/2019   Bartholin cyst 07/11/2019   Left ankle injury 09/28/2019   Contusion of left heel 09/28/2019   Tongue swelling 04/01/2021   BMS (burning mouth syndrome) 04/01/2021   TMJ tenderness, right 04/01/2021   Dry mouth 06/21/2021   Sore throat 06/21/2021   Tongue ulcer 06/21/2021   Tongue burning sensation 06/21/2021   Mass of left parotid gland 06/21/2021   Chronic pain of both knees 03/25/2022   Pernicious anemia 08/20/2022   Amenorrhea, secondary 08/20/2022   Bloating 08/20/2022   Abdominal fullness 08/20/2022   Low vitamin D level 08/20/2022   Hirsutism 08/20/2022   Fatty liver 09/12/2022   Endometrial thickening on ultrasound 09/12/2022   Resolved Ambulatory Problems    Diagnosis Date Noted   Abdominal discomfort 09/19/2014   Constipation 09/19/2014   Acute pain of right foot 01/29/2016   Past Medical History:  Diagnosis Date   Anxiety     Review of Systems   Genitourinary:        Vaginal discharge and irritation  All other systems reviewed and are negative.  Objective:    BP (!) 135/59   Pulse (!) 59   Ht 5\' 10"  (1.778 m)   Wt 223 lb (101.2 kg)   SpO2 99%   BMI 32.00 kg/m  BP Readings from Last 3 Encounters:  08/12/23 (!) 135/59  11/17/22 123/61  10/02/22 116/66   Wt Readings from Last 3 Encounters:  08/12/23 223 lb (101.2 kg)  11/17/22 223 lb 8 oz (101.4 kg)  10/02/22 226 lb (102.5 kg)      Physical Exam Constitutional:      Appearance: Normal appearance.  HENT:     Head: Normocephalic and atraumatic.  Eyes:     Extraocular Movements: Extraocular movements intact.  Cardiovascular:     Rate and Rhythm: Normal rate and regular rhythm.     Pulses: Normal pulses.     Heart sounds: Normal heart sounds.  Pulmonary:     Effort: Pulmonary effort is normal.     Breath sounds: Normal breath sounds.  Genitourinary:    General: Normal vulva.     Comments: No external rash today.  Musculoskeletal:        General: Normal range of motion.     Cervical back: Normal range of motion.  Skin:    General: Skin is warm.  Neurological:     Mental Status: She is alert.  Psychiatric:  Mood and Affect: Mood normal.        Behavior: Behavior normal.    Assessment & Plan:  Marland KitchenMarland KitchenBee was seen today for dysuria.  Diagnoses and all orders for this visit:  Vaginal discharge -     WET PREP FOR TRICH, YEAST, CLUE   No urinary symptoms and patient could not collect urine today due to no need to urinate Will wait for wet prep results to treat Could be normal discharge, no concerns for STDs. Follow up as needed with new or worsening symptoms Tandy Gaw, PA-C

## 2023-08-13 LAB — WET PREP FOR TRICH, YEAST, CLUE
Clue Cell Exam: NEGATIVE
Trichomonas Exam: NEGATIVE
Yeast Exam: NEGATIVE

## 2023-08-14 ENCOUNTER — Encounter: Payer: Self-pay | Admitting: Physician Assistant

## 2023-08-14 DIAGNOSIS — R0981 Nasal congestion: Secondary | ICD-10-CM | POA: Diagnosis not present

## 2023-08-14 DIAGNOSIS — J029 Acute pharyngitis, unspecified: Secondary | ICD-10-CM | POA: Diagnosis not present

## 2023-08-14 DIAGNOSIS — Z03818 Encounter for observation for suspected exposure to other biological agents ruled out: Secondary | ICD-10-CM | POA: Diagnosis not present

## 2023-08-14 DIAGNOSIS — R051 Acute cough: Secondary | ICD-10-CM | POA: Diagnosis not present

## 2023-08-14 NOTE — Progress Notes (Signed)
 Negative for yeast, trich or BV. Discharge appears to be normal.

## 2023-08-17 DIAGNOSIS — F331 Major depressive disorder, recurrent, moderate: Secondary | ICD-10-CM | POA: Diagnosis not present

## 2023-08-31 DIAGNOSIS — F331 Major depressive disorder, recurrent, moderate: Secondary | ICD-10-CM | POA: Diagnosis not present

## 2023-09-01 ENCOUNTER — Ambulatory Visit: Admitting: Physician Assistant

## 2023-09-05 DIAGNOSIS — N390 Urinary tract infection, site not specified: Secondary | ICD-10-CM | POA: Diagnosis not present

## 2023-09-14 ENCOUNTER — Other Ambulatory Visit: Payer: Self-pay | Admitting: Physician Assistant

## 2023-09-14 DIAGNOSIS — F330 Major depressive disorder, recurrent, mild: Secondary | ICD-10-CM

## 2023-09-15 DIAGNOSIS — F331 Major depressive disorder, recurrent, moderate: Secondary | ICD-10-CM | POA: Diagnosis not present

## 2023-09-16 ENCOUNTER — Ambulatory Visit: Admitting: Physician Assistant

## 2023-09-16 VITALS — BP 121/50 | HR 55 | Ht 70.0 in | Wt 223.0 lb

## 2023-09-16 DIAGNOSIS — S20211D Contusion of right front wall of thorax, subsequent encounter: Secondary | ICD-10-CM | POA: Diagnosis not present

## 2023-09-16 DIAGNOSIS — Z87440 Personal history of urinary (tract) infections: Secondary | ICD-10-CM

## 2023-09-16 DIAGNOSIS — S0101XD Laceration without foreign body of scalp, subsequent encounter: Secondary | ICD-10-CM

## 2023-09-16 DIAGNOSIS — W19XXXA Unspecified fall, initial encounter: Secondary | ICD-10-CM | POA: Insufficient documentation

## 2023-09-16 DIAGNOSIS — W19XXXD Unspecified fall, subsequent encounter: Secondary | ICD-10-CM

## 2023-09-16 DIAGNOSIS — T3695XA Adverse effect of unspecified systemic antibiotic, initial encounter: Secondary | ICD-10-CM

## 2023-09-16 DIAGNOSIS — S298XXD Other specified injuries of thorax, subsequent encounter: Secondary | ICD-10-CM

## 2023-09-16 DIAGNOSIS — S20211A Contusion of right front wall of thorax, initial encounter: Secondary | ICD-10-CM | POA: Insufficient documentation

## 2023-09-16 DIAGNOSIS — N898 Other specified noninflammatory disorders of vagina: Secondary | ICD-10-CM

## 2023-09-16 DIAGNOSIS — B379 Candidiasis, unspecified: Secondary | ICD-10-CM

## 2023-09-16 DIAGNOSIS — S0101XA Laceration without foreign body of scalp, initial encounter: Secondary | ICD-10-CM | POA: Insufficient documentation

## 2023-09-16 LAB — POCT URINALYSIS DIP (CLINITEK)
Bilirubin, UA: NEGATIVE
Blood, UA: NEGATIVE
Glucose, UA: NEGATIVE mg/dL
Ketones, POC UA: NEGATIVE mg/dL
Leukocytes, UA: NEGATIVE
Nitrite, UA: NEGATIVE
POC PROTEIN,UA: NEGATIVE
Spec Grav, UA: 1.02 (ref 1.010–1.025)
Urobilinogen, UA: 0.2 U/dL
pH, UA: 6 (ref 5.0–8.0)

## 2023-09-16 MED ORDER — IBUPROFEN 800 MG PO TABS: 800.0000 mg | ORAL_TABLET | Freq: Three times a day (TID) | ORAL | 0 refills | Status: AC | PRN

## 2023-09-16 MED ORDER — FLUCONAZOLE 150 MG PO TABS: 150.0000 mg | ORAL_TABLET | Freq: Once | ORAL | 0 refills | Status: AC

## 2023-09-16 MED ORDER — TRAMADOL HCL 50 MG PO TABS: 50.0000 mg | ORAL_TABLET | Freq: Four times a day (QID) | ORAL | 0 refills | Status: AC | PRN

## 2023-09-16 MED ORDER — KETOROLAC TROMETHAMINE 60 MG/2ML IM SOLN
60.0000 mg | Freq: Once | INTRAMUSCULAR | Status: AC
  Administered 2023-09-16: 60 mg via INTRAMUSCULAR

## 2023-09-16 NOTE — Progress Notes (Signed)
 Established Patient Office Visit  Subjective   Patient ID: Mallory Cooke, female    DOB: 1965-07-18  Age: 58 y.o. MRN: [id number]  Chief Complaint  Patient presents with   Fall    HPI Pt is a 58 yo female who presents to the clinic with multiple concerns.   She had a recent UTI and given abx and now has vaginal discharge and would liek to make sure urine clear and no yeast of BV. Pt admits to minor irritation vaginally.   [place] the beach on [place] and was leaving a resturant and fell and hit the right side of her head and right ribs. She went to ED at [facility]. She had to get sutures for laceration of right side of scalp. CT done of head and ribs and negative for fracture. She is still in a lot of pain. She was not given anything for pain. The most pain is her right ribs every times he burps, takes a breath or moves. It is hard to get up, turn over without pain.     ROS See HPI.    Objective:     BP (!) 121/50   Pulse (!) 55   Ht 5\' 10"  (1.778 m)   Wt 223 lb (101.2 kg)   SpO2 99%   BMI 32.00 kg/m  BP Readings from Last 3 Encounters:  09/16/23 (!) 121/50  08/12/23 (!) 135/59  11/17/22 123/61   Wt Readings from Last 3 Encounters:  09/16/23 223 lb (101.2 kg)  08/12/23 223 lb (101.2 kg)  11/17/22 223 lb 8 oz (101.4 kg)      Physical Exam Constitutional:      Appearance: Normal appearance.  HENT:     Head:     Comments: 3cm laceration with sutures to the right forehead with minimal swelling and superficial abrasions around right eye.  Cardiovascular:     Rate and Rhythm: Normal rate.  Pulmonary:     Effort: Pulmonary effort is normal.     Breath sounds: Normal breath sounds.  Musculoskeletal:     Comments: Pain to palpation over right anterior and lateral ribs with no step off No bruises noted  Neurological:     Mental Status: She is alert and oriented to person, place, and time.  Psychiatric:     Comments: anxious      Results for orders placed  or performed in visit on 09/16/23  POCT URINALYSIS DIP (CLINITEK)  Result Value Ref Range   Color, UA yellow yellow   Clarity, UA clear clear   Glucose, UA negative negative mg/dL   Bilirubin, UA negative negative   Ketones, POC UA negative negative mg/dL   Spec Grav, UA 1.478 2.956 - 1.025   Blood, UA negative negative   pH, UA 6.0 5.0 - 8.0   POC PROTEIN,UA negative negative, trace   Urobilinogen, UA 0.2 0.2 or 1.0 E.U./dL   Nitrite, UA Negative Negative   Leukocytes, UA Negative Negative     The 10-year ASCVD risk score (Arnett DK, et al., 2019) is: 2.3%    Assessment & Plan:  Marland KitchenMarland KitchenLuca was seen today for fall.  Diagnoses and all orders for this visit:  Fall, subsequent encounter -     ibuprofen (ADVIL) 800 MG tablet; Take 1 tablet (800 mg total) by mouth every 8 (eight) hours as needed. -     traMADol (ULTRAM) 50 MG tablet; Take 1 tablet (50 mg total) by mouth every 6 (six) hours as needed  for up to 5 days.  Antibiotic-induced yeast infection  Laceration of scalp without foreign body, subsequent encounter -     ibuprofen (ADVIL) 800 MG tablet; Take 1 tablet (800 mg total) by mouth every 8 (eight) hours as needed. -     traMADol (ULTRAM) 50 MG tablet; Take 1 tablet (50 mg total) by mouth every 6 (six) hours as needed for up to 5 days. -     ketorolac (TORADOL) injection 60 mg  Contusion of rib on right side, subsequent encounter -     ibuprofen (ADVIL) 800 MG tablet; Take 1 tablet (800 mg total) by mouth every 8 (eight) hours as needed. -     traMADol (ULTRAM) 50 MG tablet; Take 1 tablet (50 mg total) by mouth every 6 (six) hours as needed for up to 5 days. -     ketorolac (TORADOL) injection 60 mg  History of UTI -     POCT URINALYSIS DIP (CLINITEK) -     Urine Culture  Vaginal discharge -     NuSwab Vaginitis Plus (VG+) -     fluconazole (DIFLUCAN) 150 MG tablet; Take 1 tablet (150 mg total) by mouth once for 1 dose.  Other orders -     Cancel: NuSwab Vaginitis  Plus (VG+)   Follow up Monday for suture removal  Per patient CT of head and ribs negative for fracture  USE ice for swelling and pain 3-4 times a day Tramadol given for break through pain Toradol given IM today Ibuprofen 800mg  up to three times a day for pain Written out of work for remainder of week  UA with no concerns of UTI Reassured patient UTI cleared Will culture Sent off swab for BV/Yeast Diflucan for likely yeast after abx  Tandy Gaw, PA-C

## 2023-09-16 NOTE — Patient Instructions (Addendum)
 Toradol shot today in office Start ibuprofen 800mg  up to three times a day.  Tramadol as needed for pain control.   Rib Contusion A rib contusion is a deep bruise on the rib area. Contusions are the result of a blunt trauma that causes bleeding and injury to the tissues under the skin. A rib contusion may involve bruising of the ribs and of the skin and muscles in the area. The skin over the contusion may turn blue, purple, or yellow. Minor injuries result in a painless contusion. More severe contusions may be painful and swollen for a few weeks. What are the causes? This condition is usually caused by a hard, direct hit to an area of the body. This often occurs while playing contact sports. What are the signs or symptoms? Symptoms of this condition include: Swelling and redness of the injured area. Discoloration of the injured area. Tenderness and soreness of the injured area. Pain with or without movement. Pain when breathing in. How is this diagnosed? This condition may be diagnosed based on: Your symptoms and medical history. A physical exam. Imaging tests--such as an X-ray, CT scan, or MRI--to determine if there were internal injuries or broken bones (fractures). How is this treated? This condition may be treated with: Rest. This is often the best treatment for a rib contusion. Ice packs. This reduces swelling and inflammation. Deep-breathing exercises. These may be recommended to reduce the risk for lung collapse and pneumonia. Medicines. Over-the-counter or prescription medicines may be given to control pain. Injection of a numbing medicine around the nerve near your injury (nerve block). Follow these instructions at home: Medicines Take over-the-counter and prescription medicines only as told by your health care provider. Ask your health care provider if the medicine prescribed to you: Requires you to avoid driving or using machinery. Can cause constipation. You may need to  take these actions to prevent or treat constipation: Drink enough fluid to keep your urine pale yellow. Take over-the-counter or prescription medicines. Eat foods that are high in fiber, such as beans, whole grains, and fresh fruits and vegetables. Limit foods that are high in fat and processed sugars, such as fried or sweet foods. Managing pain, stiffness, and swelling If directed, put ice on the injured area. To do this: Put ice in a plastic bag. Place a towel between your skin and the bag. Leave the ice on for 20 minutes, 2-3 times a day. Remove the ice if your skin turns bright red. This is very important. If you cannot feel pain, heat, or cold, you have a greater risk of damage to the area.  Activity Rest the injured area. Avoid strenuous activity and any activities or movements that cause pain. Be careful during activities, and avoid bumping the injured area. Do not lift anything that is heavier than 5 lb (2.3 kg), or the limit that you are told, until your health care provider says that it is safe. General instructions  Do not use any products that contain nicotine or tobacco, such as cigarettes, e-cigarettes, and chewing tobacco. These can delay healing. If you need help quitting, ask your health care provider. Do deep-breathing exercises as told by your health care provider. If you were given an incentive spirometer, use it every 1-2 hours while you are awake, or as recommended by your health care provider. This device measures how well you are filling your lungs with each breath. Keep all follow-up visits. This is important. Contact a health care provider if you have:  Increased bruising or swelling. Pain that is not controlled with treatment. A fever. Get help right away if you: Have difficulty breathing or shortness of breath. Develop a continual cough, or you cough up thick or bloody mucus from your lungs (sputum). Feel nauseous or you vomit. Have pain in your  abdomen. These symptoms may represent a serious problem that is an emergency. Do not wait to see if the symptoms will go away. Get medical help right away. Call your local emergency services (911 in the [place] Do not drive yourself to the hospital. Summary A rib contusion is a deep bruise on your rib area. Contusions are the result of a blunt trauma that causes bleeding and injury to the tissues under the skin. The skin over the contusion may turn blue, purple, or yellow. Minor injuries may cause a painless contusion. More severe contusions may be painful and swollen for a few weeks. Rest the injured area. Avoid strenuous activity and any activities or movements that cause pain. This information is not intended to replace advice given to you by your health care provider. Make sure you discuss any questions you have with your health care provider. Document Revised: 04/13/2023 Document Reviewed: 04/13/2023 Elsevier Patient Education  2024 [employer].

## 2023-09-17 ENCOUNTER — Encounter: Payer: Self-pay | Admitting: Physician Assistant

## 2023-09-20 LAB — NUSWAB VAGINITIS PLUS (VG+)
Atopobium vaginae: HIGH {score} — AB
Candida albicans, NAA: NEGATIVE
Candida glabrata, NAA: NEGATIVE

## 2023-09-21 ENCOUNTER — Encounter: Payer: Self-pay | Admitting: Physician Assistant

## 2023-09-21 ENCOUNTER — Other Ambulatory Visit: Payer: Self-pay | Admitting: Physician Assistant

## 2023-09-21 ENCOUNTER — Ambulatory Visit: Admitting: Physician Assistant

## 2023-09-21 VITALS — BP 138/72 | HR 104 | Ht 70.0 in | Wt 223.0 lb

## 2023-09-21 DIAGNOSIS — N3000 Acute cystitis without hematuria: Secondary | ICD-10-CM | POA: Diagnosis not present

## 2023-09-21 DIAGNOSIS — Z4802 Encounter for removal of sutures: Secondary | ICD-10-CM

## 2023-09-21 DIAGNOSIS — S0101XD Laceration without foreign body of scalp, subsequent encounter: Secondary | ICD-10-CM | POA: Diagnosis not present

## 2023-09-21 DIAGNOSIS — B9689 Other specified bacterial agents as the cause of diseases classified elsewhere: Secondary | ICD-10-CM

## 2023-09-21 DIAGNOSIS — N760 Acute vaginitis: Secondary | ICD-10-CM | POA: Diagnosis not present

## 2023-09-21 LAB — URINE CULTURE

## 2023-09-21 MED ORDER — METRONIDAZOLE 500 MG PO TABS: 500.0000 mg | ORAL_TABLET | Freq: Two times a day (BID) | ORAL | 0 refills | Status: AC

## 2023-09-21 MED ORDER — CEPHALEXIN 500 MG PO CAPS: 500.0000 mg | ORAL_CAPSULE | Freq: Two times a day (BID) | ORAL | 0 refills | Status: DC

## 2023-09-21 NOTE — Patient Instructions (Addendum)
 Metronidazole for bacterial vaginosis Then boric acid suppositories OTC twice weekly for prevention  Keflex(cephlaxin) for UTI.   Vitamin E cream for laceration and scar on forehead  Vaginal Infection (Bacterial Vaginosis): What to Know  Bacterial vaginosis is an infection of the vagina. It happens when the balance of normal germs (bacteria) in the vagina changes. If you don't get treated, it can make it easier for you to get other infections from sex. These are called sexually transmitted infections (STIs). If you're pregnant, you need to get treated right away. This infection can cause a baby to be born early or at a low birth weight. What are the causes? This infection happens when too many harmful germs grow in the vagina. You can't get this infection from toilet seats, bedsheets, swimming pools, or things that touch your vagina. What increases the risk? Having sex with a new person or more than one person. Having sex without protection. Douching. Having an intrauterine device (IUD). Smoking. Using drugs or drinking alcohol. These can lead you to do risky things. Taking certain antibiotics. Being pregnant. What are the signs or symptoms? Some females have no symptoms. Symptoms may include: A gray or white discharge from your vagina. It can be watery or foamy. A fishy smell. This can happen after sex or during your menstrual period. Itching in and around your vagina. Burning or pain when you pee. How is this treated? This infection is treated with antibiotics. These may be given to you as: A pill. A cream for your vagina. A medicine that you put into your vagina (suppository). If the infection comes back, you may need more antibiotics. Follow these instructions at home: Medicines Take your medicines as told. Take or use your antibiotics as told. Do not stop using them even if you start to feel better. General instructions If the person you have sex with is a female, tell her  that you have this infection. She will need to follow up with her doctor. Female partners don't need to be treated. Do not have sex until you finish treatment. Drink more fluids as told. Keep your vagina and butt clean. Wash these areas with warm water each day. Wipe from front to back after you poop. If you're breastfeeding a baby, talk to your doctor if you should keep doing so during treatment. How is this prevented? Self-care Do not douche. Do not use deodorant sprays on your vagina. Wear cotton underwear. Do not wear tight pants and pantyhose, especially in the summer. Safe sex Use condoms the correct way and every time you have sex. Use dental dams to protect yourself during oral sex. Limit how many people you have sex with. Get tested for STIs. The person you have sex with should also get tested. Drugs and alcohol Do not smoke, vape, or use nicotine or tobacco. Do not use drugs. Limit the amount of alcohol you drink because it can lead you to do risky things. Where to find more information To learn more: Go to TonerPromos.no. Click Health Topics A-Z. Type "bacterial vaginosis" in the search bar. American Sexual Health Association (ASHA): ashasexualhealth.org [place]. Department of Health and [employer], Office on Women's Health: TravelLesson.ca Contact a doctor if: Your symptoms don't get better, even after treatment. You have more discharge or pain when you pee. You have a fever or chills. You have pain in your belly or in the area between your hips. You have pain during sex. You bleed from your vagina between menstrual periods. This  information is not intended to replace advice given to you by your health care provider. Make sure you discuss any questions you have with your health care provider. Document Revised: 11/12/2022 Document Reviewed: 11/12/2022 Elsevier Patient Education  2024 [employer].

## 2023-09-21 NOTE — Progress Notes (Signed)
 Urine culture does show infection and swab shows bacterial vaginosis.   Will treat both.

## 2023-09-22 NOTE — Progress Notes (Signed)
   Established Patient Office Visit  Subjective   Patient ID: Mallory Cooke, female    DOB: [birth year]  Age: 58 y.o. MRN: [id number]  Chief Complaint  Patient presents with   Fall    Fall, subsequent encounter    HPI Pt is a 58 yo female who returns to the clinic to have sutures removed from scalp laceration.    Objective:     BP 138/72   Pulse (!) 104   Ht 5\' 10"  (1.778 m)   Wt 223 lb (101.2 kg)   SpO2 99%   BMI 32.00 kg/m  BP Readings from Last 3 Encounters:  09/21/23 138/72  09/16/23 (!) 121/50  08/12/23 (!) 135/59   Wt Readings from Last 3 Encounters:  09/21/23 223 lb (101.2 kg)  09/16/23 223 lb (101.2 kg)  08/12/23 223 lb (101.2 kg)      Physical Exam 5 sutures removed from right forehead    The 10-year ASCVD risk score (Arnett DK, et al., 2019) is: 3%    Assessment & Plan:  .Caran was seen today for fall.  Diagnoses and all orders for this visit:  Visit for suture removal  Laceration of scalp without foreign body, subsequent encounter  Bacterial vaginosis  Acute cystitis without hematuria   Sutures removed today without complication Discussed vitamin E cream to help with scar prevention  Discussed results of positive urine culture and BV on nuswab Start metronidazole and cephalaxin  Stay hydrated  Follow up for clearance with her GYN later this month Consider boric acid suppositories twice a week for BV prevention    Trinia Georgi, PA-C

## 2023-09-23 ENCOUNTER — Encounter: Payer: Self-pay | Admitting: Physician Assistant

## 2023-09-24 ENCOUNTER — Other Ambulatory Visit: Payer: Self-pay | Admitting: Physician Assistant

## 2023-09-24 MED ORDER — GABAPENTIN 100 MG PO CAPS: 100.0000 mg | ORAL_CAPSULE | Freq: Three times a day (TID) | ORAL | 0 refills | Status: AC

## 2023-09-28 DIAGNOSIS — F331 Major depressive disorder, recurrent, moderate: Secondary | ICD-10-CM | POA: Diagnosis not present

## 2023-10-07 ENCOUNTER — Encounter: Payer: Self-pay | Admitting: Obstetrics and Gynecology

## 2023-10-07 ENCOUNTER — Other Ambulatory Visit (HOSPITAL_COMMUNITY)
Admission: RE | Admit: 2023-10-07 | Discharge: 2023-10-07 | Disposition: A | Discharge status: Home / Self Care | Source: Ambulatory Visit | Attending: Obstetrics and Gynecology | Admitting: Obstetrics and Gynecology

## 2023-10-07 ENCOUNTER — Ambulatory Visit (INDEPENDENT_AMBULATORY_CARE_PROVIDER_SITE_OTHER): Admitting: Obstetrics and Gynecology

## 2023-10-07 VITALS — BP 123/74 | HR 75 | Ht 70.0 in | Wt 222.0 lb

## 2023-10-07 DIAGNOSIS — N760 Acute vaginitis: Secondary | ICD-10-CM

## 2023-10-07 DIAGNOSIS — B9689 Other specified bacterial agents as the cause of diseases classified elsewhere: Secondary | ICD-10-CM | POA: Diagnosis not present

## 2023-10-07 DIAGNOSIS — Z01419 Encounter for gynecological examination (general) (routine) without abnormal findings: Secondary | ICD-10-CM

## 2023-10-07 DIAGNOSIS — Z01411 Encounter for gynecological examination (general) (routine) with abnormal findings: Secondary | ICD-10-CM

## 2023-10-07 NOTE — Progress Notes (Signed)
 ANNUAL EXAM Patient name: Mallory Cooke MRN [id number]  Date of birth: [birth year] Chief Complaint:   Gynecologic Exam  History of Present Illness:   Mallory Cooke is a 58 y.o. G1P0010 with No LMP recorded. (Menstrual status: Other). being seen today for a routine annual exam.  Current complaints:  Concerns about BV/yeast - saw Sandy Crumb 3/5 with vaginal irritation and discharge, urinary symptoms c/f UTI, and feeling like something just didn't feel right. Wet prep/vaginitis panel not c/w BV, Ucx w/ E coli without colony counts. Rx were sent to treat BV and urinary symptoms. Since then she is not having significant vaginal discharge or odor. She tried a probiotic and then had an episode of urge urinary incontinence. She is wondering if she still has a UTI or BV. No vaginal dryness.  Recent fall  She has not had a period in > 1 year, c/w menopausal status  Last pap 07/07/2018. Results were: NILM w/ HRHPV negative. Last mammogram: 03/20/22. Results were: BI-RADS 1. Last colonoscopy: 2023. Results were c/w tubular adenoma    11/17/2022   11:41 AM 08/20/2022    8:43 AM 04/02/2022    1:33 PM 10/24/2020    7:35 AM 07/07/2018    8:15 AM  Depression screen PHQ 2/9  Decreased Interest 0 1 1 1  0  Down, Depressed, Hopeless 1 1 1 1 1   PHQ - 2 Score 1 2 2 2 1   Altered sleeping  0 0 0 0  Tired, decreased energy  1 1 1 1   Change in appetite  0 0 1 0  Feeling bad or failure about yourself   1 0 1 0  Trouble concentrating  1 1 0 1  Moving slowly or fidgety/restless  0 0 0 0  Suicidal thoughts  0 0 0 0  PHQ-9 Score  5 4 5 3   Difficult doing work/chores  Somewhat difficult Somewhat difficult Somewhat difficult Not difficult at all        08/20/2022    8:44 AM 04/02/2022    1:35 PM 10/24/2020    7:35 AM 07/07/2018    8:15 AM  GAD 7 : Generalized Anxiety Score  Nervous, Anxious, on Edge 1 1 2 1   Control/stop worrying 1 1 1  0  Worry too much - different things 1 1 1 1   Trouble relaxing 0 0 0 0   Restless 1 0 0 0  Easily annoyed or irritable 1 1 2 1   Afraid - awful might happen 0 0 1 0  Total GAD 7 Score 5 4 7 3   Anxiety Difficulty Somewhat difficult Not difficult at all Somewhat difficult Not difficult at all     Review of Systems:   Pertinent items are noted in HPI Denies any headaches, blurred vision, fatigue, shortness of breath, chest pain, abdominal pain, abnormal vaginal discharge/itching/odor/irritation, problems with periods, bowel movements, urination, or intercourse unless otherwise stated above. Pertinent History Reviewed:  Reviewed past medical,surgical, social and family history.  Reviewed problem list, medications and allergies. Physical Assessment:   Vitals:   10/07/23 0813  BP: 123/74  Pulse: 75  Weight: 222 lb (100.7 kg)  Height: 5\' 10"  (1.778 m)  Body mass index is 31.85 kg/m.        Physical Examination:   General appearance - well appearing, and in no distress  Mental status - alert, oriented to person, place, and time  Chest - respiratory effort normal  Heart - normal peripheral perfusion  Breasts - breasts appear normal, no suspicious  masses, no skin or nipple changes or axillary nodes  Abdomen - soft, nontender. Distended without palpable mass or organomegaly  Pelvic - VULVA: normal appearing vulva with no masses, tenderness or lesions  VAGINA: normal appearing vagina with normal color and discharge, no lesions  CERVIX: normal appearing cervix without discharge or lesions, no CMT  Thin prep pap is done with HR HPV cotesting  UTERUS: uterus non palpable  ADNEXA: Left adnexal fullness and tenderness no palpable discrete mass. Normal right adnexa.   Chaperone present for exam  No results found for this or any previous visit (from the past 24 hours).  Assessment & Plan:  1) Well-Woman Exam Mammogram: schedule screening mammo as soon as possible Colonoscopy: Per GI/PCP Pap: Collected today GC/CT: Declines HIV/HCV: Will get HIV (care gap  noted) Discussed that she is eligible for pneumococcal, zoster, and covid vaccines. She declines. Reviewed menopausal status and that she should notify us  with any vaginal bleeding  2) Abnormal pelvic exam - abdominal distension and left adnexal fullness/tenderness - Pelvic US  ordered  3) Vulvovaginal concerns I personally reviewed her note from Sandy Crumb on 08/12/23 as well has the following results from 09/16/23 - urine culture, urinalysis, and vaginitis panel Normal exam today and asymptomatic Would not recommend repeat testing or additional rounds of antibiotics at this time Call for appt if symptoms return  Labs/procedures today:   Orders Placed This Encounter  Procedures   US  PELVIC COMPLETE WITH TRANSVAGINAL   MM 3D SCREENING MAMMOGRAM BILATERAL BREAST   HIV antibody (with reflex)    Meds: No orders of the defined types were placed in this encounter.  Follow-up: Return in about 1 year (around 10/06/2024) for annual exam.  Izell Marsh, MD 10/07/2023 8:54 AM

## 2023-10-07 NOTE — Patient Instructions (Addendum)
 It was good catching up with you today! You did not have any signs of BV or a UTI based on your symptoms and exam today.   You will see your pap results in the MyChart app within 1 week   Avoid: - Synthetic underwear - Tight pants - Swim suits, thongs, leotards, leggings for prolonged periods of time - Scented soap/shampoo - Bubble baths - Scented detergents, dryer sheets - Baby wipes - Feminine sprays, douches, powders - Panty liners - Dyed toilet paper - Shaving  Trying swapping out the above for: - Cotton or no underwear - Loose pants, skirts, dresses - Changing out of swimwear, thongs, and workout gear as soon as you're done exercising - Fragrance free soaps (like Dove sensitive skin) - Warm plain water baths - Unscented laundry detergent - Use a bedet or peri bottle to rinse instead of baby wipes - Tampons, cotton pads, cotton period underwear - Undyed toilet paper - Clipping hair

## 2023-10-08 LAB — HIV ANTIBODY (ROUTINE TESTING W REFLEX): HIV Screen 4th Generation wRfx: NONREACTIVE

## 2023-10-09 LAB — CYTOLOGY - PAP
Adequacy: ABSENT
Comment: NEGATIVE
Diagnosis: NEGATIVE
High risk HPV: NEGATIVE

## 2023-10-10 ENCOUNTER — Encounter: Payer: Self-pay | Admitting: Obstetrics and Gynecology

## 2023-10-11 ENCOUNTER — Encounter: Payer: Self-pay | Admitting: Obstetrics and Gynecology

## 2023-10-13 ENCOUNTER — Ambulatory Visit

## 2023-10-13 VITALS — BP 122/70 | HR 77 | Wt 226.0 lb

## 2023-10-13 DIAGNOSIS — R350 Frequency of micturition: Secondary | ICD-10-CM | POA: Diagnosis not present

## 2023-10-13 DIAGNOSIS — R3915 Urgency of urination: Secondary | ICD-10-CM | POA: Diagnosis not present

## 2023-10-13 LAB — POCT URINALYSIS DIPSTICK
Bilirubin, UA: NEGATIVE
Blood, UA: NEGATIVE
Glucose, UA: NEGATIVE
Ketones, UA: NEGATIVE
Leukocytes, UA: NEGATIVE
Nitrite, UA: NEGATIVE
Protein, UA: NEGATIVE
Spec Grav, UA: 1.02 (ref 1.010–1.025)
Urobilinogen, UA: 0.2 U/dL
pH, UA: 6.5 (ref 5.0–8.0)

## 2023-10-13 NOTE — Progress Notes (Signed)
 SUBJECTIVE: Mallory Cooke is a 58 y.o. female who complains of urinary frequency, urgency and dysuria x more than thirty days, without flank pain, fever, chills, or abnormal vaginal discharge or bleeding.   OBJECTIVE: Appears well, in no apparent distress.  Vital signs are normal. Urine dipstick shows negative for all components.    ASSESSMENT: Dysuria  PLAN: Urine culture ordered. Call or return to clinic prn if these symptoms worsen or fail to improve as anticipated.   Brei Pociask l Caliber [place]ess, CMA

## 2023-10-16 ENCOUNTER — Other Ambulatory Visit: Payer: Self-pay | Admitting: Obstetrics and Gynecology

## 2023-10-16 LAB — URINE CULTURE

## 2023-10-16 MED ORDER — SULFAMETHOXAZOLE-TRIMETHOPRIM 800-160 MG PO TABS: 1.0000 | ORAL_TABLET | Freq: Two times a day (BID) | ORAL | 0 refills | Status: AC

## 2023-10-26 DIAGNOSIS — F331 Major depressive disorder, recurrent, moderate: Secondary | ICD-10-CM | POA: Diagnosis not present

## 2023-11-04 ENCOUNTER — Ambulatory Visit

## 2023-11-04 ENCOUNTER — Other Ambulatory Visit

## 2023-11-09 DIAGNOSIS — F331 Major depressive disorder, recurrent, moderate: Secondary | ICD-10-CM | POA: Diagnosis not present

## 2023-11-16 ENCOUNTER — Encounter: Payer: Self-pay | Admitting: Physician Assistant

## 2023-11-16 ENCOUNTER — Other Ambulatory Visit: Payer: Self-pay | Admitting: Physician Assistant

## 2023-11-16 MED ORDER — CYCLOBENZAPRINE HCL 10 MG PO TABS: 10.0000 mg | ORAL_TABLET | Freq: Three times a day (TID) | ORAL | 1 refills | Status: AC | PRN

## 2023-11-16 NOTE — Telephone Encounter (Signed)
 Request rx rf of cyclobenzaprine  10mg   Last written 08/06/2022 Spoke with pharmacy they do not have any refills for patient.  Last OV 09/16/2023 Upcoming appt= none

## 2023-11-19 ENCOUNTER — Ambulatory Visit

## 2023-11-23 DIAGNOSIS — F331 Major depressive disorder, recurrent, moderate: Secondary | ICD-10-CM | POA: Diagnosis not present

## 2023-12-07 DIAGNOSIS — F331 Major depressive disorder, recurrent, moderate: Secondary | ICD-10-CM | POA: Diagnosis not present

## 2023-12-21 DIAGNOSIS — F331 Major depressive disorder, recurrent, moderate: Secondary | ICD-10-CM | POA: Diagnosis not present

## 2024-01-04 DIAGNOSIS — F331 Major depressive disorder, recurrent, moderate: Secondary | ICD-10-CM | POA: Diagnosis not present

## 2024-01-18 DIAGNOSIS — F331 Major depressive disorder, recurrent, moderate: Secondary | ICD-10-CM | POA: Diagnosis not present

## 2024-02-01 DIAGNOSIS — F331 Major depressive disorder, recurrent, moderate: Secondary | ICD-10-CM | POA: Diagnosis not present

## 2024-02-15 DIAGNOSIS — F331 Major depressive disorder, recurrent, moderate: Secondary | ICD-10-CM | POA: Diagnosis not present

## 2024-02-29 DIAGNOSIS — F331 Major depressive disorder, recurrent, moderate: Secondary | ICD-10-CM | POA: Diagnosis not present

## 2024-03-14 DIAGNOSIS — F331 Major depressive disorder, recurrent, moderate: Secondary | ICD-10-CM | POA: Diagnosis not present

## 2024-03-20 ENCOUNTER — Encounter: Payer: Self-pay | Admitting: Physician Assistant

## 2024-04-11 DIAGNOSIS — F331 Major depressive disorder, recurrent, moderate: Secondary | ICD-10-CM | POA: Diagnosis not present

## 2024-04-18 ENCOUNTER — Other Ambulatory Visit: Payer: Self-pay | Admitting: Physician Assistant

## 2024-04-18 DIAGNOSIS — F330 Major depressive disorder, recurrent, mild: Secondary | ICD-10-CM

## 2024-04-25 DIAGNOSIS — F331 Major depressive disorder, recurrent, moderate: Secondary | ICD-10-CM | POA: Diagnosis not present

## 2024-05-09 DIAGNOSIS — F331 Major depressive disorder, recurrent, moderate: Secondary | ICD-10-CM | POA: Diagnosis not present
